# Patient Record
Sex: Female | Born: 1935 | Race: White | Hispanic: No | Marital: Married | Smoking: Never smoker
Health system: Southern US, Community
[De-identification: ages and names within clinical notes are randomized; demographics above are authoritative.]

## PROBLEM LIST (undated history)

## (undated) DIAGNOSIS — M810 Age-related osteoporosis without current pathological fracture: Secondary | ICD-10-CM

## (undated) DIAGNOSIS — I700 Atherosclerosis of aorta: Secondary | ICD-10-CM

## (undated) DIAGNOSIS — K219 Gastro-esophageal reflux disease without esophagitis: Secondary | ICD-10-CM

## (undated) DIAGNOSIS — T148XXA Other injury of unspecified body region, initial encounter: Secondary | ICD-10-CM

## (undated) DIAGNOSIS — M25559 Pain in unspecified hip: Secondary | ICD-10-CM

## (undated) DIAGNOSIS — R001 Bradycardia, unspecified: Secondary | ICD-10-CM

## (undated) DIAGNOSIS — I10 Essential (primary) hypertension: Secondary | ICD-10-CM

## (undated) HISTORY — DX: Pain in unspecified hip: M25.559

## (undated) HISTORY — DX: Age-related osteoporosis without current pathological fracture: M81.0

## (undated) HISTORY — DX: Atherosclerosis of aorta: I70.0

## (undated) HISTORY — PX: ANKLE FRACTURE SURGERY: SHX122

## (undated) HISTORY — DX: Other injury of unspecified body region, initial encounter: T14.8XXA

## (undated) HISTORY — DX: Bradycardia, unspecified: R00.1

## (undated) HISTORY — DX: Gastro-esophageal reflux disease without esophagitis: K21.9

## (undated) HISTORY — PX: ABDOMINAL HYSTERECTOMY: SHX81

---

## 1999-09-21 ENCOUNTER — Other Ambulatory Visit: Admission: RE | Admit: 1999-09-21 | Discharge: 1999-09-21 | Payer: Self-pay | Admitting: Obstetrics and Gynecology

## 2000-09-22 ENCOUNTER — Other Ambulatory Visit: Admission: RE | Admit: 2000-09-22 | Discharge: 2000-09-22 | Payer: Self-pay | Admitting: Obstetrics and Gynecology

## 2001-09-23 ENCOUNTER — Other Ambulatory Visit: Admission: RE | Admit: 2001-09-23 | Discharge: 2001-09-23 | Payer: Self-pay | Admitting: Obstetrics and Gynecology

## 2018-08-24 DIAGNOSIS — M419 Scoliosis, unspecified: Secondary | ICD-10-CM

## 2018-08-24 DIAGNOSIS — M5416 Radiculopathy, lumbar region: Secondary | ICD-10-CM

## 2018-08-24 HISTORY — DX: Scoliosis, unspecified: M41.9

## 2018-08-24 HISTORY — DX: Radiculopathy, lumbar region: M54.16

## 2018-09-07 ENCOUNTER — Ambulatory Visit: Payer: Self-pay | Admitting: Orthopedic Surgery

## 2018-09-09 ENCOUNTER — Ambulatory Visit: Payer: Self-pay | Admitting: Orthopedic Surgery

## 2018-09-09 NOTE — H&P (Signed)
Donna Christian is an 83 y.o. female.   Chief Complaint: back and R leg pain HPI: Reported by patient. Reason for Visit: (normal) review of test results (lumbar MRI); The patient is 1 month out from when symptoms began. 07/26/18 Location (Lower Extremity): leg pain on the right, , ; right buttock Severity: pain level 5/10 Timing: constant Aggravating Factors: standing for ; sitting for Medications: The patient is taking Tramadol and Aleve. Patient waiting for the epidural.  Past Medical Hx CA HTN Osteoporosis  Medications alendronate 70 mg tablet amLODIPine 10 mg tablet losartan 100 mg-hydrochlorothiazide 12.5 mg tablet metoprolol succinate ER 50 mg tablet,extended release 24 hr traMADol 50 mg tablet  No family history on file.   Social History Tobacco Smoking Status: Never smoker Chewing tobacco: none Alcohol intake: None Work related injury?: N Advance directive: Y Freight forwarder of Attorney: N Marital status: Married  Allergies: NKDA  Review of Systems  Constitutional: Negative.   HENT: Negative.   Eyes: Negative.   Respiratory: Negative.   Cardiovascular: Negative.   Gastrointestinal: Negative.   Genitourinary: Negative.   Musculoskeletal: Positive for back pain.  Skin: Negative.   Neurological: Positive for sensory change and focal weakness.  Psychiatric/Behavioral: Negative.     There were no vitals taken for this visit. Physical Exam  Constitutional: She is oriented to person, place, and time. She appears well-developed.  HENT:  Head: Normocephalic.  Eyes: Pupils are equal, round, and reactive to light.  Neck: Normal range of motion.  Cardiovascular: Normal rate.  Respiratory: Effort normal.  GI: Soft.  Musculoskeletal:  Patient is an 83 year old female.  Gait and Station: Appearance: ambulating with no assistive devices and antalgic gait.  Constitutional: General Appearance: healthy-appearing and distress (mild).  Psychiatric: Mood and Affect:  active and alert.  Cardiovascular System: Edema Right: none; Dorsalis and posterior tibial pulses 2+. Edema Left: none.  Abdomen: Inspection and Palpation: non-distended and no tenderness.  Skin: Inspection and palpation: no rash.  Lumbar Spine: Inspection: normal alignment. Bony Palpation of the Lumbar Spine: tender at lumbosacral junction.. Bony Palpation of the Right Hip: no tenderness of the greater trochanter and tenderness of the SI joint; Pelvis stable. Bony Palpation of the Left Hip: no tenderness of the greater trochanter and tenderness of the SI joint. Soft Tissue Palpation on the Right: No flank pain with percussion. Active Range of Motion: limited flexion and extention.  Motor Strength: L1 Motor Strength on the Right: hip flexion iliopsoas 5/5. L1 Motor Strength on the Left: hip flexion iliopsoas 5/5. L2-L4 Motor Strength on the Right: knee extension quadriceps 5/5. L2-L4 Motor Strength on the Left: knee extension quadriceps 5/5. L5 Motor Strength on the Right: ankle dorsiflexion tibialis anterior 5/5 and great toe extension extensor hallucis longus 5/5. L5 Motor Strength on the Left: ankle dorsiflexion tibialis anterior 5/5 and great toe extension extensor hallucis longus 5/5. S1 Motor Strength on the Right: plantar flexion gastrocnemius 5/5. S1 Motor Strength on the Left: plantar flexion gastrocnemius 5/5.  Neurological System: Knee Reflex Right: normal (2). Knee Reflex Left: normal (2). Ankle Reflex Right: normal (2). Ankle Reflex Left: normal (2). Babinski Reflex Right: plantar reflex absent. Babinski Reflex Left: plantar reflex absent. Sensation on the Right: normal distal extremities. Sensation on the Left: normal distal extremities. Special Tests on the Right: no clonus of the ankle/knee and seated straight leg raising test positive. Special Tests on the Left: no clonus of the ankle/knee and seated straight leg raising test positive.  Normal cervical lordosis. No pain with  range of  motion. No palpable tenderness. Motor is 5/5 in all groups in the upper extremities. Upper extremity sensory exam normal. Patient is normoreflexic in the upper extremities. No Hoffmann sign.  Neurological: She is alert and oriented to person, place, and time.  Skin: Skin is warm and dry.    MRI demonstrates large disc herniation paracentral to the right displacement of the 4 root.  Assessment/Plan Patient demonstrates L4-L5 radiculopathy secondary disc herniation L4-5 to the right.  EHL weakness slight quad weakness.  Discussed this with her over the phone.  It is likely this will require surgical intervention for relief. She has been miserable.  In the interim were trying to expedite an epidural steroid injection.  In the meantime I gave her a SI joint injection on the right which he tolerated well.  In addition she can increase her tramadol from 1 a day up to 3 a day. Also take Tylenol on top of that.  Also activity modification strategies to unload the disc.  Also discussed surgical intervention I will start that process with the preoperative clearance.  I had an extensive discussion with the patient concerning the pathology relevant anatomy and treatment options. At this point exhausting conservative treatment and in the presence of a neurologic deficit we discussed microlumbar decompression. I discussed the risks and benefits including bleeding, infection, DVT, PE, anesthetic complications, worsening in their symptoms, improvement in their symptoms, C SF leakage, epidural fibrosis, need for future surgeries such as revision discectomy and lumbar fusion. I also indicated that this is an operation to basically decompress the nerve root to allow recovery as opposed to fixing a herniated disc and that the incidence of recurrent chest disc herniation can approach 15%. Also that nerve root recovery is variable and may not recover completely.  I discussed the operative course including  overnight in the hospital. Immediate ambulation. Follow-up in 2 weeks for suture removal. 6 weeks until healing of the herniation followed by 6 weeks of reconditioning and strengthening of the core musculature. Also discussed the need to employ the concepts of disc pressure management and core motion following the surgery to minimize the risk of recurrent disc herniation. We will obtain preoperative clearance i if necessary and proceed accordingly.  Plan microlumbar decompression L4-5 right  Cecilie Kicks., PA-C for Dr. Tonita Cong 09/09/2018, 8:36 AM

## 2018-09-09 NOTE — H&P (View-Only) (Signed)
Donna Christian is an 83 y.o. female.   Chief Complaint: back and R leg pain HPI: Reported by patient. Reason for Visit: (normal) review of test results (lumbar MRI); The patient is 1 month out from when symptoms began. 07/26/18 Location (Lower Extremity): leg pain on the right, , ; right buttock Severity: pain level 5/10 Timing: constant Aggravating Factors: standing for ; sitting for Medications: The patient is taking Tramadol and Aleve. Patient waiting for the epidural.  Past Medical Hx CA HTN Osteoporosis  Medications alendronate 70 mg tablet amLODIPine 10 mg tablet losartan 100 mg-hydrochlorothiazide 12.5 mg tablet metoprolol succinate ER 50 mg tablet,extended release 24 hr traMADol 50 mg tablet  No family history on file.   Social History Tobacco Smoking Status: Never smoker Chewing tobacco: none Alcohol intake: None Work related injury?: N Advance directive: Y Freight forwarder of Attorney: N Marital status: Married  Allergies: NKDA  Review of Systems  Constitutional: Negative.   HENT: Negative.   Eyes: Negative.   Respiratory: Negative.   Cardiovascular: Negative.   Gastrointestinal: Negative.   Genitourinary: Negative.   Musculoskeletal: Positive for back pain.  Skin: Negative.   Neurological: Positive for sensory change and focal weakness.  Psychiatric/Behavioral: Negative.     There were no vitals taken for this visit. Physical Exam  Constitutional: She is oriented to person, place, and time. She appears well-developed.  HENT:  Head: Normocephalic.  Eyes: Pupils are equal, round, and reactive to light.  Neck: Normal range of motion.  Cardiovascular: Normal rate.  Respiratory: Effort normal.  GI: Soft.  Musculoskeletal:  Patient is an 83 year old female.  Gait and Station: Appearance: ambulating with no assistive devices and antalgic gait.  Constitutional: General Appearance: healthy-appearing and distress (mild).  Psychiatric: Mood and Affect:  active and alert.  Cardiovascular System: Edema Right: none; Dorsalis and posterior tibial pulses 2+. Edema Left: none.  Abdomen: Inspection and Palpation: non-distended and no tenderness.  Skin: Inspection and palpation: no rash.  Lumbar Spine: Inspection: normal alignment. Bony Palpation of the Lumbar Spine: tender at lumbosacral junction.. Bony Palpation of the Right Hip: no tenderness of the greater trochanter and tenderness of the SI joint; Pelvis stable. Bony Palpation of the Left Hip: no tenderness of the greater trochanter and tenderness of the SI joint. Soft Tissue Palpation on the Right: No flank pain with percussion. Active Range of Motion: limited flexion and extention.  Motor Strength: L1 Motor Strength on the Right: hip flexion iliopsoas 5/5. L1 Motor Strength on the Left: hip flexion iliopsoas 5/5. L2-L4 Motor Strength on the Right: knee extension quadriceps 5/5. L2-L4 Motor Strength on the Left: knee extension quadriceps 5/5. L5 Motor Strength on the Right: ankle dorsiflexion tibialis anterior 5/5 and great toe extension extensor hallucis longus 5/5. L5 Motor Strength on the Left: ankle dorsiflexion tibialis anterior 5/5 and great toe extension extensor hallucis longus 5/5. S1 Motor Strength on the Right: plantar flexion gastrocnemius 5/5. S1 Motor Strength on the Left: plantar flexion gastrocnemius 5/5.  Neurological System: Knee Reflex Right: normal (2). Knee Reflex Left: normal (2). Ankle Reflex Right: normal (2). Ankle Reflex Left: normal (2). Babinski Reflex Right: plantar reflex absent. Babinski Reflex Left: plantar reflex absent. Sensation on the Right: normal distal extremities. Sensation on the Left: normal distal extremities. Special Tests on the Right: no clonus of the ankle/knee and seated straight leg raising test positive. Special Tests on the Left: no clonus of the ankle/knee and seated straight leg raising test positive.  Normal cervical lordosis. No pain with  range of  motion. No palpable tenderness. Motor is 5/5 in all groups in the upper extremities. Upper extremity sensory exam normal. Patient is normoreflexic in the upper extremities. No Hoffmann sign.  Neurological: She is alert and oriented to person, place, and time.  Skin: Skin is warm and dry.    MRI demonstrates large disc herniation paracentral to the right displacement of the 4 root.  Assessment/Plan Patient demonstrates L4-L5 radiculopathy secondary disc herniation L4-5 to the right.  EHL weakness slight quad weakness.  Discussed this with her over the phone.  It is likely this will require surgical intervention for relief. She has been miserable.  In the interim were trying to expedite an epidural steroid injection.  In the meantime I gave her a SI joint injection on the right which he tolerated well.  In addition she can increase her tramadol from 1 a day up to 3 a day. Also take Tylenol on top of that.  Also activity modification strategies to unload the disc.  Also discussed surgical intervention I will start that process with the preoperative clearance.  I had an extensive discussion with the patient concerning the pathology relevant anatomy and treatment options. At this point exhausting conservative treatment and in the presence of a neurologic deficit we discussed microlumbar decompression. I discussed the risks and benefits including bleeding, infection, DVT, PE, anesthetic complications, worsening in their symptoms, improvement in their symptoms, C SF leakage, epidural fibrosis, need for future surgeries such as revision discectomy and lumbar fusion. I also indicated that this is an operation to basically decompress the nerve root to allow recovery as opposed to fixing a herniated disc and that the incidence of recurrent chest disc herniation can approach 15%. Also that nerve root recovery is variable and may not recover completely.  I discussed the operative course including  overnight in the hospital. Immediate ambulation. Follow-up in 2 weeks for suture removal. 6 weeks until healing of the herniation followed by 6 weeks of reconditioning and strengthening of the core musculature. Also discussed the need to employ the concepts of disc pressure management and core motion following the surgery to minimize the risk of recurrent disc herniation. We will obtain preoperative clearance i if necessary and proceed accordingly.  Plan microlumbar decompression L4-5 right  Cecilie Kicks., PA-C for Dr. Tonita Cong 09/09/2018, 8:36 AM

## 2018-09-16 NOTE — Pre-Procedure Instructions (Signed)
Donna Christian  09/16/2018      [facility], [place] [address] Phone: [phone] Fax: [phone]    Your procedure is scheduled on Oct. 31  Report to [facility] at 5;30  A.M.  Call this number if you have problems the morning of surgery:  [phone]   Remember:   Do not eat or drink after midnight.      Take these medicines the morning of surgery with A SIP OF WATER :              Amlodipine (norvasc)             Metoprolol (toprol-XL)              7 days prior to surgery STOP taking any Aspirin(unless otherwise instructed by your surgeon), Aleve, Naproxen, Ibuprofen, Motrin, Advil, Goody's, BC's, all herbal medications, fish oil, and all vitamins    Do not wear jewelry, make-up or nail polish.  Do not wear lotions, powders, or perfumes, or deodorant.  Do not shave 48 hours prior to surgery.  Men may shave face and neck.  Do not bring valuables to the hospital.  [facility] is not responsible for any belongings or valuables.  Contacts, dentures or bridgework may not be worn into surgery.  Leave your suitcase in the car.  After surgery it may be brought to your room.  For patients admitted to the hospital, discharge time will be determined by your treatment team.  Patients discharged the day of surgery will not be allowed to drive home.    Special instructions:  [place]- Preparing For Surgery  Before surgery, you can play an important role. Because skin is not sterile, your skin needs to be as free of germs as possible. You can reduce the number of germs on your skin by washing with CHG (chlorahexidine gluconate) Soap before surgery.  CHG is an antiseptic cleaner which kills germs and bonds with the skin to continue killing germs even after washing.    Oral Hygiene is also important to reduce your risk of infection.  Remember - BRUSH YOUR TEETH THE MORNING OF SURGERY  WITH YOUR REGULAR TOOTHPASTE  Please do not use if you have an allergy to CHG or antibacterial soaps. If your skin becomes reddened/irritated stop using the CHG.  Do not shave (including legs and underarms) for at least 48 hours prior to first CHG shower. It is OK to shave your face.  Please follow these instructions carefully.   1. Shower the NIGHT BEFORE SURGERY and the MORNING OF SURGERY with CHG.   2. If you chose to wash your hair, wash your hair first as usual with your normal shampoo.  3. After you shampoo, rinse your hair and body thoroughly to remove the shampoo.  4. Use CHG as you would any other liquid soap. You can apply CHG directly to the skin and wash gently with a scrungie or a clean washcloth.   5. Apply the CHG Soap to your body ONLY FROM THE NECK DOWN.  Do not use on open wounds or open sores. Avoid contact with your eyes, ears, mouth and genitals (private parts). Wash Face and genitals (private parts)  with your normal soap.  6. Wash thoroughly, paying special attention to the area where your surgery will be performed.  7. Thoroughly rinse your body with warm water from the neck down.  8. DO NOT shower/wash with your normal soap after using and rinsing off the CHG Soap.  9. Pat yourself dry with a CLEAN TOWEL.  10. Wear CLEAN PAJAMAS to bed the night before surgery, wear comfortable clothes the morning of surgery  11. Place CLEAN SHEETS on your bed the night of your first shower and DO NOT SLEEP WITH PETS.    Day of Surgery:  Do not apply any deodorants/lotions.  Please wear clean clothes to the hospital/surgery center.   Remember to brush your teeth WITH YOUR REGULAR TOOTHPASTE.    Please read over the following fact sheets that you were given. Coughing and Deep Breathing, MRSA Information and Surgical Site Infection Prevention

## 2018-09-17 ENCOUNTER — Encounter (HOSPITAL_COMMUNITY)
Admission: RE | Admit: 2018-09-17 | Discharge: 2018-09-17 | Disposition: A | Discharge status: Home / Self Care | Payer: Medicare Other | Source: Ambulatory Visit | Attending: Specialist | Admitting: Specialist

## 2018-09-17 ENCOUNTER — Other Ambulatory Visit: Payer: Self-pay

## 2018-09-17 ENCOUNTER — Ambulatory Visit (HOSPITAL_COMMUNITY)
Admission: RE | Admit: 2018-09-17 | Discharge: 2018-09-17 | Disposition: A | Discharge status: Home / Self Care | Payer: Medicare Other | Source: Ambulatory Visit | Attending: Orthopedic Surgery | Admitting: Orthopedic Surgery

## 2018-09-17 ENCOUNTER — Encounter (HOSPITAL_COMMUNITY): Payer: Self-pay

## 2018-09-17 DIAGNOSIS — Z01818 Encounter for other preprocedural examination: Secondary | ICD-10-CM | POA: Diagnosis present

## 2018-09-17 DIAGNOSIS — M5136 Other intervertebral disc degeneration, lumbar region: Secondary | ICD-10-CM | POA: Insufficient documentation

## 2018-09-17 DIAGNOSIS — M5126 Other intervertebral disc displacement, lumbar region: Secondary | ICD-10-CM

## 2018-09-17 DIAGNOSIS — M48061 Spinal stenosis, lumbar region without neurogenic claudication: Secondary | ICD-10-CM | POA: Diagnosis not present

## 2018-09-17 DIAGNOSIS — I700 Atherosclerosis of aorta: Secondary | ICD-10-CM | POA: Insufficient documentation

## 2018-09-17 DIAGNOSIS — M4186 Other forms of scoliosis, lumbar region: Secondary | ICD-10-CM | POA: Insufficient documentation

## 2018-09-17 HISTORY — DX: Essential (primary) hypertension: I10

## 2018-09-17 LAB — BASIC METABOLIC PANEL
Anion gap: 5 (ref 5–15)
BUN: 24 mg/dL — AB (ref 8–23)
CHLORIDE: 109 mmol/L (ref 98–111)
CO2: 29 mmol/L (ref 22–32)
CREATININE: 0.81 mg/dL (ref 0.44–1.00)
Calcium: 8.7 mg/dL — ABNORMAL LOW (ref 8.9–10.3)
GFR calc Af Amer: >60 mL/min (ref >60)
GFR calc non Af Amer: >60 mL/min (ref >60)
GLUCOSE: 113 mg/dL — AB (ref 70–99)
Potassium: 3.4 mmol/L — ABNORMAL LOW (ref 3.5–5.1)
Sodium: 143 mmol/L (ref 135–145)

## 2018-09-17 LAB — CBC
HEMATOCRIT: 43 % (ref 36.0–46.0)
Hemoglobin: 14.2 g/dL (ref 12.0–15.0)
MCH: 29 pg (ref 26.0–34.0)
MCHC: 33 g/dL (ref 30.0–36.0)
MCV: 87.8 fL (ref 80.0–100.0)
Platelets: 348 10*3/uL (ref 150–400)
RBC: 4.9 MIL/uL (ref 3.87–5.11)
RDW: 13.6 % (ref 11.5–15.5)
WBC: 8.9 10*3/uL (ref 4.0–10.5)
nRBC: 0 % (ref 0.0–0.2)

## 2018-09-17 LAB — SURGICAL PCR SCREEN
MRSA, PCR: NEGATIVE
Staphylococcus aureus: NEGATIVE

## 2018-09-17 NOTE — Progress Notes (Signed)
PCP - Dr. Maury Dus  Cardiologist - Denies  Chest x-ray - Lumbar- 09/17/18  EKG - 09/17/18  Stress Test - Denies  ECHO - Denies  Cardiac Cath - Denies  AICD- n/a PM- n/a LOOP- n/a  Sleep Study - Denies CPAP - None  LABS- 09/17/18: CBC, BMP, PCR  ASA- Denies   Anesthesia- No  Pt denies having chest pain, sob, or fever at this time. All instructions explained to the pt, with a verbal understanding of the material. Pt agrees to go over the instructions while at home for a better understanding. The opportunity to ask questions was provided.

## 2018-09-23 NOTE — Anesthesia Preprocedure Evaluation (Addendum)
Anesthesia Evaluation  Patient identified by MRN, date of birth, ID band Patient awake    Reviewed: Allergy & Precautions, NPO status , Patient's Chart, lab work & pertinent test results  Airway Mallampati: II  TM Distance: >3 FB Neck ROM: Full    Dental no notable dental hx. (+) Teeth Intact, Dental Advisory Given   Pulmonary neg pulmonary ROS,    Pulmonary exam normal breath sounds clear to auscultation       Cardiovascular hypertension, Pt. on medications and Pt. on home beta blockers negative cardio ROS Normal cardiovascular exam Rhythm:Regular Rate:Normal     Neuro/Psych negative neurological ROS  negative psych ROS   GI/Hepatic negative GI ROS, Neg liver ROS,   Endo/Other  negative endocrine ROS  Renal/GU negative Renal ROS  negative genitourinary   Musculoskeletal  (+) Arthritis , Osteoarthritis,    Abdominal   Peds  Hematology negative hematology ROS (+)   Anesthesia Other Findings L4-5 herniated disc, RLE paresthesias   Reproductive/Obstetrics                           Anesthesia Physical Anesthesia Plan  ASA: II  Anesthesia Plan: General   Post-op Pain Management:    Induction: Intravenous  PONV Risk Score and Plan: 3 and Treatment may vary due to age or medical condition, Dexamethasone and Ondansetron  Airway Management Planned: Oral ETT  Additional Equipment:   Intra-op Plan:   Post-operative Plan: Extubation in OR  Informed Consent: I have reviewed the patients History and Physical, chart, labs and discussed the procedure including the risks, benefits and alternatives for the proposed anesthesia with the patient or authorized representative who has indicated his/her understanding and acceptance.   Dental advisory given  Plan Discussed with: CRNA  Anesthesia Plan Comments:        Anesthesia Quick Evaluation

## 2018-09-24 ENCOUNTER — Ambulatory Visit (HOSPITAL_COMMUNITY): Payer: Medicare Other

## 2018-09-24 ENCOUNTER — Ambulatory Visit (HOSPITAL_COMMUNITY): Payer: Medicare Other | Admitting: Anesthesiology

## 2018-09-24 ENCOUNTER — Ambulatory Visit (HOSPITAL_COMMUNITY)
Admission: RE | Disposition: A | Discharge status: Home / Self Care | Payer: Self-pay | Source: Ambulatory Visit | Attending: Specialist

## 2018-09-24 ENCOUNTER — Encounter (HOSPITAL_COMMUNITY): Payer: Self-pay | Admitting: Certified Registered Nurse Anesthetist

## 2018-09-24 ENCOUNTER — Ambulatory Visit (HOSPITAL_COMMUNITY)
Admission: RE | Admit: 2018-09-24 | Discharge: 2018-09-25 | Disposition: A | Discharge status: Home / Self Care | Payer: Medicare Other | Source: Ambulatory Visit | Attending: Specialist | Admitting: Specialist

## 2018-09-24 DIAGNOSIS — M48061 Spinal stenosis, lumbar region without neurogenic claudication: Secondary | ICD-10-CM | POA: Diagnosis not present

## 2018-09-24 DIAGNOSIS — I10 Essential (primary) hypertension: Secondary | ICD-10-CM | POA: Diagnosis not present

## 2018-09-24 DIAGNOSIS — Z79899 Other long term (current) drug therapy: Secondary | ICD-10-CM | POA: Diagnosis not present

## 2018-09-24 DIAGNOSIS — Z419 Encounter for procedure for purposes other than remedying health state, unspecified: Secondary | ICD-10-CM

## 2018-09-24 DIAGNOSIS — M5116 Intervertebral disc disorders with radiculopathy, lumbar region: Secondary | ICD-10-CM | POA: Insufficient documentation

## 2018-09-24 DIAGNOSIS — R2681 Unsteadiness on feet: Secondary | ICD-10-CM | POA: Diagnosis not present

## 2018-09-24 HISTORY — PX: LUMBAR LAMINECTOMY/DECOMPRESSION MICRODISCECTOMY: SHX5026

## 2018-09-24 HISTORY — DX: Spinal stenosis, lumbar region without neurogenic claudication: M48.061

## 2018-09-24 SURGERY — LUMBAR LAMINECTOMY/DECOMPRESSION MICRODISCECTOMY 1 LEVEL
Anesthesia: General | Site: Spine Lumbar

## 2018-09-24 MED ORDER — ALUM & MAG HYDROXIDE-SIMETH 200-200-20 MG/5ML PO SUSP: 30.0000 mL | Freq: Four times a day (QID) | ORAL | Status: DC | PRN

## 2018-09-24 MED ORDER — AMLODIPINE BESYLATE 5 MG PO TABS
10.0000 mg | ORAL_TABLET | Freq: Every day | ORAL | Status: DC
  Administered 2018-09-25: 10 mg via ORAL
  Filled 2018-09-24: qty 2

## 2018-09-24 MED ORDER — LOSARTAN POTASSIUM 50 MG PO TABS
100.0000 mg | ORAL_TABLET | Freq: Every day | ORAL | Status: DC
  Administered 2018-09-25: 100 mg via ORAL
  Filled 2018-09-24: qty 2

## 2018-09-24 MED ORDER — ONDANSETRON HCL 4 MG/2ML IJ SOLN
INTRAMUSCULAR | Status: DC | PRN
  Administered 2018-09-24: 4 mg via INTRAVENOUS

## 2018-09-24 MED ORDER — FENTANYL CITRATE (PF) 250 MCG/5ML IJ SOLN
INTRAMUSCULAR | Status: AC
  Filled 2018-09-24: qty 5

## 2018-09-24 MED ORDER — CEFAZOLIN SODIUM-DEXTROSE 2-4 GM/100ML-% IV SOLN
2.0000 g | INTRAVENOUS | Status: AC
  Administered 2018-09-24: 2 g via INTRAVENOUS

## 2018-09-24 MED ORDER — LACTATED RINGERS IV SOLN: INTRAVENOUS | Status: DC

## 2018-09-24 MED ORDER — BUPIVACAINE-EPINEPHRINE 0.5% -1:200000 IJ SOLN
INTRAMUSCULAR | Status: DC | PRN
  Administered 2018-09-24: 7 mL

## 2018-09-24 MED ORDER — PROPOFOL 10 MG/ML IV BOLUS
INTRAVENOUS | Status: DC | PRN
  Administered 2018-09-24: 90 mg via INTRAVENOUS

## 2018-09-24 MED ORDER — ACETAMINOPHEN 10 MG/ML IV SOLN
1000.0000 mg | INTRAVENOUS | Status: AC
  Administered 2018-09-24: 1000 mg via INTRAVENOUS
  Filled 2018-09-24: qty 100

## 2018-09-24 MED ORDER — PHENOL 1.4 % MT LIQD: 1.0000 | OROMUCOSAL | Status: DC | PRN

## 2018-09-24 MED ORDER — 0.9 % SODIUM CHLORIDE (POUR BTL) OPTIME
TOPICAL | Status: DC | PRN
  Administered 2018-09-24: 1000 mL

## 2018-09-24 MED ORDER — MAGNESIUM CITRATE PO SOLN: 1.0000 | Freq: Once | ORAL | Status: DC | PRN

## 2018-09-24 MED ORDER — METHOCARBAMOL 1000 MG/10ML IJ SOLN
500.0000 mg | Freq: Four times a day (QID) | INTRAVENOUS | Status: DC | PRN
  Filled 2018-09-24: qty 5

## 2018-09-24 MED ORDER — ROCURONIUM BROMIDE 50 MG/5ML IV SOSY
PREFILLED_SYRINGE | INTRAVENOUS | Status: DC | PRN
  Administered 2018-09-24: 50 mg via INTRAVENOUS

## 2018-09-24 MED ORDER — SUGAMMADEX SODIUM 200 MG/2ML IV SOLN
INTRAVENOUS | Status: DC | PRN
  Administered 2018-09-24: 150 mg via INTRAVENOUS

## 2018-09-24 MED ORDER — ONDANSETRON HCL 4 MG/2ML IJ SOLN: 4.0000 mg | Freq: Four times a day (QID) | INTRAMUSCULAR | Status: DC | PRN

## 2018-09-24 MED ORDER — RISAQUAD PO CAPS
1.0000 | ORAL_CAPSULE | Freq: Every day | ORAL | Status: DC
  Administered 2018-09-24 – 2018-09-25 (×2): 1 via ORAL
  Filled 2018-09-24 (×2): qty 1

## 2018-09-24 MED ORDER — BUPIVACAINE-EPINEPHRINE 0.5% -1:200000 IJ SOLN
INTRAMUSCULAR | Status: AC
  Filled 2018-09-24: qty 1

## 2018-09-24 MED ORDER — PHENYLEPHRINE 40 MCG/ML (10ML) SYRINGE FOR IV PUSH (FOR BLOOD PRESSURE SUPPORT)
PREFILLED_SYRINGE | INTRAVENOUS | Status: AC
  Filled 2018-09-24: qty 10

## 2018-09-24 MED ORDER — METHOCARBAMOL 500 MG PO TABS: 500.0000 mg | ORAL_TABLET | Freq: Four times a day (QID) | ORAL | Status: DC | PRN

## 2018-09-24 MED ORDER — OXYCODONE HCL 5 MG PO TABS
5.0000 mg | ORAL_TABLET | ORAL | Status: DC | PRN
  Administered 2018-09-25: 5 mg via ORAL
  Filled 2018-09-24: qty 1

## 2018-09-24 MED ORDER — SODIUM CHLORIDE 0.9 % IV SOLN
INTRAVENOUS | Status: DC | PRN
  Administered 2018-09-24: 500 mL

## 2018-09-24 MED ORDER — LOSARTAN POTASSIUM-HCTZ 100-12.5 MG PO TABS: 1.0000 | ORAL_TABLET | Freq: Every day | ORAL | Status: DC

## 2018-09-24 MED ORDER — DEXAMETHASONE SODIUM PHOSPHATE 10 MG/ML IJ SOLN
INTRAMUSCULAR | Status: AC
  Filled 2018-09-24: qty 1

## 2018-09-24 MED ORDER — PROPOFOL 10 MG/ML IV BOLUS
INTRAVENOUS | Status: AC
  Filled 2018-09-24: qty 20

## 2018-09-24 MED ORDER — LACTATED RINGERS IV SOLN
INTRAVENOUS | Status: DC | PRN
  Administered 2018-09-24 (×2): via INTRAVENOUS

## 2018-09-24 MED ORDER — BISACODYL 5 MG PO TBEC: 5.0000 mg | DELAYED_RELEASE_TABLET | Freq: Every day | ORAL | Status: DC | PRN

## 2018-09-24 MED ORDER — LIDOCAINE 2% (20 MG/ML) 5 ML SYRINGE
INTRAMUSCULAR | Status: AC
  Filled 2018-09-24: qty 5

## 2018-09-24 MED ORDER — ACETAMINOPHEN 325 MG PO TABS: 650.0000 mg | ORAL_TABLET | ORAL | Status: DC | PRN

## 2018-09-24 MED ORDER — EPHEDRINE SULFATE 50 MG/ML IJ SOLN
INTRAMUSCULAR | Status: DC | PRN
  Administered 2018-09-24 (×3): 5 mg via INTRAVENOUS

## 2018-09-24 MED ORDER — OXYCODONE HCL 5 MG PO TABS: 5.0000 mg | ORAL_TABLET | ORAL | 0 refills | Status: AC | PRN

## 2018-09-24 MED ORDER — METOPROLOL SUCCINATE ER 50 MG PO TB24
50.0000 mg | ORAL_TABLET | Freq: Every day | ORAL | Status: DC
  Administered 2018-09-25: 50 mg via ORAL
  Filled 2018-09-24: qty 1

## 2018-09-24 MED ORDER — CEFAZOLIN SODIUM-DEXTROSE 1-4 GM/50ML-% IV SOLN
1.0000 g | Freq: Three times a day (TID) | INTRAVENOUS | Status: AC
  Administered 2018-09-24 (×2): 1 g via INTRAVENOUS
  Filled 2018-09-24 (×2): qty 50

## 2018-09-24 MED ORDER — TRAMADOL HCL 50 MG PO TABS
50.0000 mg | ORAL_TABLET | Freq: Every day | ORAL | Status: DC
  Administered 2018-09-24: 50 mg via ORAL
  Filled 2018-09-24: qty 1

## 2018-09-24 MED ORDER — MENTHOL 3 MG MT LOZG: 1.0000 | LOZENGE | OROMUCOSAL | Status: DC | PRN

## 2018-09-24 MED ORDER — THROMBIN 20000 UNITS EX SOLR
CUTANEOUS | Status: DC | PRN
  Administered 2018-09-24: 08:00:00 via TOPICAL

## 2018-09-24 MED ORDER — KCL IN DEXTROSE-NACL 20-5-0.45 MEQ/L-%-% IV SOLN: INTRAVENOUS | Status: AC

## 2018-09-24 MED ORDER — DOCUSATE SODIUM 100 MG PO CAPS
100.0000 mg | ORAL_CAPSULE | Freq: Two times a day (BID) | ORAL | Status: DC
  Administered 2018-09-24: 100 mg via ORAL
  Filled 2018-09-24 (×2): qty 1

## 2018-09-24 MED ORDER — NAPROXEN SODIUM 220 MG PO TABS: 440.0000 mg | ORAL_TABLET | Freq: Every day | ORAL | Status: AC

## 2018-09-24 MED ORDER — HYDROMORPHONE HCL 1 MG/ML IJ SOLN: 0.5000 mg | INTRAMUSCULAR | Status: DC | PRN

## 2018-09-24 MED ORDER — ONDANSETRON HCL 4 MG/2ML IJ SOLN
INTRAMUSCULAR | Status: AC
  Filled 2018-09-24: qty 2

## 2018-09-24 MED ORDER — HYDROCHLOROTHIAZIDE 12.5 MG PO CAPS
12.5000 mg | ORAL_CAPSULE | Freq: Every day | ORAL | Status: DC
  Administered 2018-09-25: 12.5 mg via ORAL
  Filled 2018-09-24: qty 1

## 2018-09-24 MED ORDER — OXYCODONE HCL 5 MG PO TABS
10.0000 mg | ORAL_TABLET | ORAL | Status: DC | PRN
  Administered 2018-09-24: 5 mg via ORAL
  Filled 2018-09-24: qty 2

## 2018-09-24 MED ORDER — CEFAZOLIN SODIUM-DEXTROSE 2-4 GM/100ML-% IV SOLN
INTRAVENOUS | Status: AC
  Filled 2018-09-24: qty 100

## 2018-09-24 MED ORDER — THROMBIN (RECOMBINANT) 20000 UNITS EX SOLR
CUTANEOUS | Status: AC
  Filled 2018-09-24: qty 20000

## 2018-09-24 MED ORDER — FENTANYL CITRATE (PF) 100 MCG/2ML IJ SOLN
INTRAMUSCULAR | Status: DC | PRN
  Administered 2018-09-24: 25 ug via INTRAVENOUS
  Administered 2018-09-24 (×2): 50 ug via INTRAVENOUS

## 2018-09-24 MED ORDER — POLYETHYLENE GLYCOL 3350 17 G PO PACK: 17.0000 g | PACK | Freq: Every day | ORAL | Status: DC | PRN

## 2018-09-24 MED ORDER — DEXAMETHASONE SODIUM PHOSPHATE 10 MG/ML IJ SOLN
INTRAMUSCULAR | Status: DC | PRN
  Administered 2018-09-24: 10 mg via INTRAVENOUS

## 2018-09-24 MED ORDER — POLYETHYLENE GLYCOL 3350 17 G PO PACK: 17.0000 g | PACK | Freq: Every day | ORAL | 0 refills | Status: AC

## 2018-09-24 MED ORDER — DOCUSATE SODIUM 100 MG PO CAPS: 100.0000 mg | ORAL_CAPSULE | Freq: Two times a day (BID) | ORAL | 2 refills | Status: AC

## 2018-09-24 MED ORDER — ACETAMINOPHEN 650 MG RE SUPP: 650.0000 mg | RECTAL | Status: DC | PRN

## 2018-09-24 MED ORDER — LIDOCAINE 2% (20 MG/ML) 5 ML SYRINGE
INTRAMUSCULAR | Status: DC | PRN
  Administered 2018-09-24: 60 mg via INTRAVENOUS

## 2018-09-24 MED ORDER — ONDANSETRON HCL 4 MG PO TABS: 4.0000 mg | ORAL_TABLET | Freq: Four times a day (QID) | ORAL | Status: DC | PRN

## 2018-09-24 SURGICAL SUPPLY — 57 items
BAG DECANTER FOR FLEXI CONT (MISCELLANEOUS) ×2 IMPLANT
CLEANER TIP ELECTROSURG 2X2 (MISCELLANEOUS) ×2 IMPLANT
CLOTH 2% CHLOROHEXIDINE 3PK (PERSONAL CARE ITEMS) ×2 IMPLANT
CLSR STERI-STRIP ANTIMIC 1/2X4 (GAUZE/BANDAGES/DRESSINGS) ×2 IMPLANT
CONT SPEC 4OZ CLIKSEAL STRL BL (MISCELLANEOUS) ×2 IMPLANT
COVER WAND RF STERILE (DRAPES) ×2 IMPLANT
DRAPE LAPAROTOMY 100X72X124 (DRAPES) ×2 IMPLANT
DRAPE MICROSCOPE LEICA (MISCELLANEOUS) ×2 IMPLANT
DRAPE SHEET LG 3/4 BI-LAMINATE (DRAPES) ×2 IMPLANT
DRAPE SURG 17X11 SM STRL (DRAPES) ×2 IMPLANT
DRAPE UTILITY XL STRL (DRAPES) ×2 IMPLANT
DRESSING AQUACEL AG SP 3.5X6 (GAUZE/BANDAGES/DRESSINGS) ×1 IMPLANT
DRSG AQUACEL AG ADV 3.5X 4 (GAUZE/BANDAGES/DRESSINGS) IMPLANT
DRSG AQUACEL AG ADV 3.5X 6 (GAUZE/BANDAGES/DRESSINGS) IMPLANT
DRSG AQUACEL AG SP 3.5X6 (GAUZE/BANDAGES/DRESSINGS) ×2
DRSG TELFA 3X8 NADH (GAUZE/BANDAGES/DRESSINGS) IMPLANT
DURAPREP 26ML APPLICATOR (WOUND CARE) ×2 IMPLANT
DURASEAL SPINE SEALANT 3ML (MISCELLANEOUS) IMPLANT
ELECT BLADE 4.0 EZ CLEAN MEGAD (MISCELLANEOUS) ×2
ELECT REM PT RETURN 9FT ADLT (ELECTROSURGICAL) ×2
ELECTRODE BLDE 4.0 EZ CLN MEGD (MISCELLANEOUS) ×1 IMPLANT
ELECTRODE REM PT RTRN 9FT ADLT (ELECTROSURGICAL) ×1 IMPLANT
GLOVE BIOGEL PI IND STRL 7.0 (GLOVE) ×3 IMPLANT
GLOVE BIOGEL PI IND STRL 7.5 (GLOVE) ×4 IMPLANT
GLOVE BIOGEL PI INDICATOR 7.0 (GLOVE) ×3
GLOVE BIOGEL PI INDICATOR 7.5 (GLOVE) ×4
GLOVE SURG SS PI 7.5 STRL IVOR (GLOVE) ×2 IMPLANT
GLOVE SURG SS PI 8.0 STRL IVOR (GLOVE) ×6 IMPLANT
GOWN STRL REUS W/ TWL LRG LVL3 (GOWN DISPOSABLE) ×1 IMPLANT
GOWN STRL REUS W/ TWL XL LVL3 (GOWN DISPOSABLE) ×2 IMPLANT
GOWN STRL REUS W/TWL LRG LVL3 (GOWN DISPOSABLE) ×1
GOWN STRL REUS W/TWL XL LVL3 (GOWN DISPOSABLE) ×2
IV CATH 14GX2 1/4 (CATHETERS) ×2 IMPLANT
KIT BASIN OR (CUSTOM PROCEDURE TRAY) ×2 IMPLANT
KIT POSITION SURG JACKSON T1 (MISCELLANEOUS) ×2 IMPLANT
NEEDLE 22X1 1/2 (OR ONLY) (NEEDLE) ×2 IMPLANT
NEEDLE SPNL 18GX3.5 QUINCKE PK (NEEDLE) ×4 IMPLANT
PACK LAMINECTOMY NEURO (CUSTOM PROCEDURE TRAY) ×2 IMPLANT
PATTIES SURGICAL .75X.75 (GAUZE/BANDAGES/DRESSINGS) ×2 IMPLANT
RUBBERBAND STERILE (MISCELLANEOUS) ×4 IMPLANT
SPONGE LAP 4X18 RFD (DISPOSABLE) ×2 IMPLANT
SPONGE SURGIFOAM ABS GEL 100 (HEMOSTASIS) ×2 IMPLANT
STAPLER VISISTAT (STAPLE) IMPLANT
STRIP CLOSURE SKIN 1/2X4 (GAUZE/BANDAGES/DRESSINGS) ×2 IMPLANT
SUT NURALON 4 0 TR CR/8 (SUTURE) IMPLANT
SUT PROLENE 3 0 PS 2 (SUTURE) ×2 IMPLANT
SUT VIC AB 1 CT1 27 (SUTURE) ×2
SUT VIC AB 1 CT1 27XBRD ANBCTR (SUTURE) ×2 IMPLANT
SUT VIC AB 1 CT1 27XBRD ANTBC (SUTURE) IMPLANT
SUT VIC AB 1-0 CT2 27 (SUTURE) IMPLANT
SUT VIC AB 2-0 CT1 27 (SUTURE)
SUT VIC AB 2-0 CT1 TAPERPNT 27 (SUTURE) IMPLANT
SUT VIC AB 2-0 CT2 27 (SUTURE) ×2 IMPLANT
SYR 3ML LL SCALE MARK (SYRINGE) ×2 IMPLANT
TOWEL GREEN STERILE (TOWEL DISPOSABLE) ×2 IMPLANT
TOWEL GREEN STERILE FF (TOWEL DISPOSABLE) ×2 IMPLANT
YANKAUER SUCT BULB TIP NO VENT (SUCTIONS) ×2 IMPLANT

## 2018-09-24 NOTE — Brief Op Note (Signed)
09/24/2018  9:37 AM  PATIENT:  Donna Christian  83 y.o. female  PRE-OPERATIVE DIAGNOSIS:  Herniated Nucleus Pulposus Lumbar Four-Five Right  POST-OPERATIVE DIAGNOSIS:  Herniated Nucleus Pulposus Lumbar Four-Five Right  PROCEDURE:  Procedure(s): Microlumbar decompression Lumbar Four-Five  SURGEON:  Surgeon(s) and Role:    Susa Day, MD - Primary  PHYSICIAN ASSISTANT:   ASSISTANTS: [place]   ANESTHESIA:   general  EBL:  50 mL   BLOOD ADMINISTERED:none  DRAINS: none   LOCAL MEDICATIONS USED:  MARCAINE     SPECIMEN:  No Specimen  DISPOSITION OF SPECIMEN:  N/A  COUNTS:  YES  TOURNIQUET:  * No tourniquets in log *  DICTATION: .Other Dictation: Dictation Number [id number]  PLAN OF CARE: Admit for overnight observation  PATIENT DISPOSITION:  PACU - hemodynamically stable.   Delay start of Pharmacological VTE agent (>24hrs) due to surgical blood loss or risk of bleeding: yes

## 2018-09-24 NOTE — Interval H&P Note (Signed)
History and Physical Interval Note:  09/24/2018 7:20 AM  Donna Christian  has presented today for surgery, with the diagnosis of HNP L4-5 Right  The various methods of treatment have been discussed with the patient and family. After consideration of risks, benefits and other options for treatment, the patient has consented to  Procedure(s) with comments: Microlumbar decompression L4-5 Right (Right) - 90 mins as a surgical intervention .  The patient's history has been reviewed, patient examined, no change in status, stable for surgery.  I have reviewed the patient's chart and labs.  Questions were answered to the patient's satisfaction.     Damarius Karnes C

## 2018-09-24 NOTE — Anesthesia Procedure Notes (Signed)
Procedure Name: Intubation Date/Time: 09/24/2018 7:33 AM Performed by: Inda Coke, CRNA Pre-anesthesia Checklist: Patient identified, Emergency Drugs available, Suction available and Patient being monitored Patient Re-evaluated:Patient Re-evaluated prior to induction Oxygen Delivery Method: Circle System Utilized Preoxygenation: Pre-oxygenation with 100% oxygen Induction Type: IV induction and Cricoid Pressure applied Ventilation: Mask ventilation without difficulty Laryngoscope Size: Mac and 3 Grade View: Grade I Tube type: Oral Number of attempts: 1 Airway Equipment and Method: Stylet and Oral airway Placement Confirmation: ETT inserted through vocal cords under direct vision,  positive ETCO2 and breath sounds checked- equal and bilateral Secured at: 21 cm Tube secured with: Tape Dental Injury: Teeth and Oropharynx as per pre-operative assessment

## 2018-09-24 NOTE — Discharge Instructions (Signed)

## 2018-09-24 NOTE — Transfer of Care (Signed)
Immediate Anesthesia Transfer of Care Note  Patient: Donna Christian  Procedure(s) Performed: Microlumbar decompression Lumbar Four-Five (Spine Lumbar)  Patient Location: PACU  Anesthesia Type:General  Level of Consciousness: awake and drowsy  Airway & Oxygen Therapy: Patient Spontanous Breathing and Patient connected to nasal cannula oxygen  Post-op Assessment: Report given to RN and Post -op Vital signs reviewed and stable  Post vital signs: Reviewed and stable  Last Vitals:  Vitals Value Taken Time  BP 145/78 09/24/2018  9:48 AM  Temp    Pulse 61 09/24/2018  9:48 AM  Resp 12 09/24/2018  9:48 AM  SpO2 98 % 09/24/2018  9:48 AM  Vitals shown include unvalidated device data.  Last Pain:  Vitals:   09/24/18 0640  TempSrc:   PainSc: 6          Complications: No apparent anesthesia complications

## 2018-09-24 NOTE — Op Note (Signed)
NAMEDENISHIA, CITRO MEDICAL RECORD RS:[id number] ACCOUNT [id number] DATE OF BIRTH:1934/12/29 FACILITY: [facility] LOCATION: [facility] PHYSICIAN:Danell Vazquez Windy Kalata, MD  OPERATIVE REPORT  DATE OF PROCEDURE:  09/24/2018  PREOPERATIVE DIAGNOSIS:  Spinal stenosis, herniated nucleus pulposus L4-5.  POSTOPERATIVE DIAGNOSIS:  Spinal stenosis, herniated nucleus pulposus L4-5.  PROCEDURE PERFORMED:   1.  Central microlumbar decompression L4-L5 with bilateral hemilaminotomies. 2.  Foraminotomy of 4 and 5 on the right.   3.  Microdiskectomy 4-5 right.  ANESTHESIA:  General.  SURGEON:  Susa Day, MD  ASSISTANT:  Lacie Draft, PA  HISTORY:  An 83 year old with disk herniation, scoliosis, migrating cephalad out into the foramen compressing the 4 root.  She had neural tension signs, quad weakness tibialis anterior weakness indicated for microlumbar decompression failing conservative  treatment including therapy, injections, activity modification.  Discussed decompression.  Risks and benefits discussed including bleeding, infection, damage to neurovascular structures, no change in symptoms, worsening symptoms, DVT, PE, anesthetic  complications, etc.  DESCRIPTION OF PROCEDURE:  With the patient in supine position after induction of adequate general anesthesia, 2 grams Kefzol.  She was placed prone on the [place] table.  All bony prominences well padded.  Lumbar region was prepped and draped in the  usual sterile fashion.  Two 18-gauge spinal needles were utilized to localize 4-5 interspace confirmed with x-ray.  Incision was made from the spinous process of 4-5 subcutaneous tissue was dissected with electrocautery to achieve hemostasis.  Dorsal  lumbar fascia divided in line with the skin incision.  Paraspinous muscles elevated from the lamina 4-5 bilaterally after infiltration with 0.25% Marcaine with epinephrine.  The McCullough retractor was placed.  Confirmatory radiograph obtained.  The  operating  microscope was draped and brought into the surgical field.  I removed the spinous processes partially at 4 and 5 as there was a very small interlaminar window and apposition of the spinous processes.  Following that, I used a micro curette to  detach ligamentum flavum from the cephalad edge of 5 bilaterally and then the caudad edge of 4.  Essentially, we performed a bilateral hemilaminotomy started centrally first with a 2 mm Kerrison to the point detaching the ligamentum flavum and the mid  and to the right and also in the hemilamina on the left.  Ligamentum flavum was removed from the interspace; however, there is some significant adherence to the right, extending down into the lateral recess.  I detached ligamentum flavum from the  cephalad edge of 5.  I utilized a micro curette on the right.  Preserving the pars, we decompressed the lateral recess meticulously utilizing FA-20 curette just to detach ligamentum flavum from the remaining lamina.  I identified the thecal sac just  cephalad to the foramen 4-5 on the right as the disk herniation was noted to be into the foramen.  There were adhesions of the lateral aspect of the thecal sac and the 4 root to the pedicle and to the lamina.  We meticulously and carefully developed a  plane between that thecal sac and the bony lamina and pedicle.  As we tracked out to the foramen, I performed a foraminotomy of 4, preserving the pars.  This was performed with a FA-20 micro curette detached the ligamentum flavum a thin 2 mm Kerrison.  I  also decompressed the lateral recess to the medial border of the pedicle of 5 utilizing the FA-20 microcurette protecting the lateral recess and the 5 root, which was noted to be severely impinged into the lateral recess.  We  used a neuro patty to  protect the elements.  After freeing up the lateral aspect of the shoulder of the 4 root and protecting the 4 root, I used a Penfield 4 to use a window to get to the disk that was beneath  the 4 root.  We used a micro nerve hook to remove 2 small  fragments.  The remainder of the disk protrusion was fairly hard and non-removable.  I felt that performing a foraminotomy and the small fragments that were removed adequately produced room for the L4 nerve root as a Woodson retractor passed freely out  the foramen of 4 and 5.  There were some adhesions and the residual of epidural steroid injections that were noted laterally as well.  Following this, confirmatory radiograph obtained.  We used bipolar cautery to achieve hemostasis.  There were some  epidural veins that were noted as well.  Bone wax was placed on the cancellous surfaces.  A confirmatory radiograph obtained with an instrument in the foramen of 4-5.  I thought it felt well decompressed and there was a fair amount of tethering out  laterally of the 4 root that we probed it out to the lateral aspect of the pedicle of 4.  The nerve root head was noted initially to be fairly compressed out into the foramen both by disk and the facet ligamentum flavum hypertrophy.  Again, following  that there was ample room for the 4 root and the 5 root.  No CSF leakage or active bleeding.  Copiously irrigated the wound.  One small patty of thrombin-soaked Gelfoam was placed lateral to the shoulder of the 4 root.  I removed the McCullough  retractor.  Paraspinous muscles inspected and cauterized with no active bleeding.  I closed the dorsal lumbar fascia #1 Vicryl, subcutaneous with 2-0 and skin with Prolene.  Sterile dressing applied.  Placed supine on the hospital bed, extubated without  difficulty and transported to the recovery room in satisfactory condition.  The patient tolerated the procedure well.  No complications.  Assistant was [employer], [place].  Blood loss was 50 mL.  TN/NUANCE  D:09/24/2018 T:09/24/2018 JOB:[id number]

## 2018-09-24 NOTE — Anesthesia Postprocedure Evaluation (Signed)
Anesthesia Post Note  Patient: Sharhonda Keithly  Procedure(s) Performed: Microlumbar decompression Lumbar Four-Five (Spine Lumbar)     Patient location during evaluation: PACU Anesthesia Type: General Level of consciousness: awake and alert Pain management: pain level controlled Vital Signs Assessment: post-procedure vital signs reviewed and stable Respiratory status: spontaneous breathing, nonlabored ventilation, respiratory function stable and patient connected to nasal cannula oxygen Cardiovascular status: blood pressure returned to baseline and stable Postop Assessment: no apparent nausea or vomiting Anesthetic complications: no    Last Vitals:  Vitals:   09/24/18 1035 09/24/18 1359  BP: 124/72 (!) 122/58  Pulse: 63 64  Resp: 18   Temp: (!) 36.4 C 36.7 C  SpO2: 97% 94%    Last Pain:  Vitals:   09/24/18 1359  TempSrc: Oral  PainSc:                  Isacc Turney L Rhodes Calvert

## 2018-09-25 ENCOUNTER — Encounter (HOSPITAL_COMMUNITY): Payer: Self-pay | Admitting: Specialist

## 2018-09-25 DIAGNOSIS — M48061 Spinal stenosis, lumbar region without neurogenic claudication: Secondary | ICD-10-CM | POA: Diagnosis not present

## 2018-09-25 LAB — BASIC METABOLIC PANEL
Anion gap: 6 (ref 5–15)
BUN: 17 mg/dL (ref 8–23)
CALCIUM: 8.5 mg/dL — AB (ref 8.9–10.3)
CO2: 28 mmol/L (ref 22–32)
Chloride: 106 mmol/L (ref 98–111)
Creatinine, Ser: 0.75 mg/dL (ref 0.44–1.00)
Glucose, Bld: 114 mg/dL — ABNORMAL HIGH (ref 70–99)
Potassium: 3.6 mmol/L (ref 3.5–5.1)
SODIUM: 140 mmol/L (ref 135–145)

## 2018-09-25 MED FILL — Thrombin (Recombinant) For Soln 20000 Unit: CUTANEOUS | Qty: 1 | Status: AC

## 2018-09-25 NOTE — Progress Notes (Signed)
Subjective: 1 Day Post-Op Procedure(s): Microlumbar decompression Lumbar Four-Five Patient reports pain as mild.   Reports leg pain resolved. Noting soreness in back. No N/V, CP, SOB. No difficulty voiding. Feels able to go home today.  Objective: Vital signs in last 24 hours: Temp:  [97.5 F (36.4 C)-98.8 F (37.1 C)] 98.2 F (36.8 C) (11/01 0718) Pulse Rate:  [59-70] 63 (11/01 0718) Resp:  [13-18] 16 (11/01 0718) BP: (112-147)/(54-78) 147/66 (11/01 0718) SpO2:  [94 %-99 %] 98 % (11/01 0718)  Intake/Output from previous day: 10/31 0701 - 11/01 0700 In: 1640 [P.O.:240; I.V.:1200; IV Piggyback:200] Out: 53 [Urine:3; Blood:50] Intake/Output this shift: No intake/output data recorded.  No results for input(s): HGB in the last 72 hours. No results for input(s): WBC, RBC, HCT, PLT in the last 72 hours. Recent Labs    09/25/18 0411  NA 140  K 3.6  CL 106  CO2 28  BUN 17  CREATININE 0.75  GLUCOSE 114*  CALCIUM 8.5*   No results for input(s): LABPT, INR in the last 72 hours.  Neurologically intact ABD soft Neurovascular intact Sensation intact distally Intact pulses distally Dorsiflexion/Plantar flexion intact Incision: dressing C/D/I and no drainage No cellulitis present Compartment soft no calf pain or sign of DVT  Assessment/Plan: 1 Day Post-Op Procedure(s): Microlumbar decompression Lumbar Four-Five Advance diet Up with therapy D/C IV fluids Discussed D/C instructions, Lspine precautions, dressing instructions D/C home today Will discuss with Dr. Tonita Cong See as outpt in 2 weeks suture removal   BISSELL, JACLYN M. 09/25/2018, 7:50 AM

## 2018-09-25 NOTE — Evaluation (Signed)
Physical Therapy Evaluation Patient Details Name: Donna Christian MRN: [id number] DOB: 05-15-1935 Today's Date: 09/25/2018   History of Present Illness  This 83 y.o. admitted with HNP L4-5, and underwent microlumbar discompression L4-5.   PMH includes:  HTN  Clinical Impression  Patient evaluated by Physical Therapy with no further acute PT needs identified. Ambulating hallway distances without an assistive device with supervision. Recommended cane for balance, especially over unlevel surfaces. Negotiated 4 steps with railing to prepare for discharge home. Educated patient on generalized walking program and work modifications. All education has been completed and the patient has no further questions. No follow-up Physical Therapy or equipment needs. PT is signing off. Thank you for this referral.     Follow Up Recommendations No PT follow up    Equipment Recommendations  None recommended by PT    Recommendations for Other Services       Precautions / Restrictions Precautions Precautions: Back Precaution Booklet Issued: Yes (comment) Precaution Comments: Verbally reviewed and provided written handout Restrictions Weight Bearing Restrictions: No      Mobility  Bed Mobility Overal bed mobility: Modified Independent             General bed mobility comments: Cues for log roll technique  Transfers Overall transfer level: Independent Equipment used: None                Ambulation/Gait Ambulation/Gait assistance: Supervision Gait Distance (Feet): 450 Feet Assistive device: None Gait Pattern/deviations: Step-through pattern;Wide base of support Gait velocity: decreased   General Gait Details: Pt with left foot external rotation which she states is baseline. Occasionally reaching for railing for additional external support  Stairs Stairs: Yes Stairs assistance: Min guard Stair Management: One rail Right Number of Stairs: 4 General stair comments: Step over step  technique with min guard assist.   Wheelchair Mobility    Modified Rankin (Stroke Patients Only)       Balance Overall balance assessment: Mild deficits observed, not formally tested                                           Pertinent Vitals/Pain Pain Assessment: Faces Faces Pain Scale: Hurts a little bit Pain Location: incisional Pain Descriptors / Indicators: Sore Pain Intervention(s): Monitored during session    Home Living Family/patient expects to be discharged to:: Private residence Living Arrangements: Spouse/significant other Available Help at Discharge: Family Type of Home: House Home Access: Stairs to enter Entrance Stairs-Rails: None Entrance Stairs-Number of Steps: 2 Home Layout: One level Home Equipment: Cane - single point      Prior Function Level of Independence: Independent with assistive device(s)         Comments: Use of cane for mobility since September. Works as a Product/process development scientist Extremity Assessment Upper Extremity Assessment: Defer to OT evaluation    Lower Extremity Assessment Lower Extremity Assessment: RLE deficits/detail;LLE deficits/detail RLE Deficits / Details: Strength 5/5 LLE Deficits / Details: Strength 5/5    Cervical / Trunk Assessment Cervical / Trunk Assessment: Other exceptions Cervical / Trunk Exceptions: s/p microlumbar decompression  Communication   Communication: No difficulties  Cognition Arousal/Alertness: Awake/alert Behavior During Therapy: WFL for tasks assessed/performed Overall Cognitive Status: Within Functional Limits for tasks assessed  General Comments      Exercises     Assessment/Plan    PT Assessment Patent does not need any further PT services  PT Problem List         PT Treatment Interventions      PT Goals (Current goals can be found in the Care Plan  section)  Acute Rehab PT Goals Patient Stated Goal: "return to work part time" PT Goal Formulation: All assessment and education complete, DC therapy    Frequency     Barriers to discharge        Co-evaluation               AM-PAC PT "6 Clicks" Daily Activity  Outcome Measure Difficulty turning over in bed (including adjusting bedclothes, sheets and blankets)?: None Difficulty moving from lying on back to sitting on the side of the bed? : None Difficulty sitting down on and standing up from a chair with arms (e.g., wheelchair, bedside commode, etc,.)?: None Help needed moving to and from a bed to chair (including a wheelchair)?: None Help needed walking in hospital room?: A Little Help needed climbing 3-5 steps with a railing? : A Little 6 Click Score: 22    End of Session Equipment Utilized During Treatment: Gait belt Activity Tolerance: Patient tolerated treatment well Patient left: in bed;with call bell/phone within reach Nurse Communication: Mobility status PT Visit Diagnosis: Unsteadiness on feet (R26.81);Difficulty in walking, not elsewhere classified (R26.2);Pain Pain - part of body: (back)    Time: 1025-8527 PT Time Calculation (min) (ACUTE ONLY): 14 min   Charges:   PT Evaluation $PT Eval Low Complexity: [place], PT, DPT Acute Rehabilitation Services Pager [phone] Office [phone]   Willy Eddy 09/25/2018, 10:34 AM

## 2018-09-25 NOTE — Evaluation (Signed)
Occupational Therapy Evaluation Patient Details Name: Donna Christian MRN: [id number] DOB: 15-Nov-1935 Today's Date: 09/25/2018    History of Present Illness This 83 y.o. admitted with HNP L4-5, and underwent microlumbar discompression L4-5.   PMH includes:  HTN   Clinical Impression   Patient evaluated by Occupational Therapy with no further acute OT needs identified. All education has been completed and the patient has no further questions. Pt instructed in back precautions and safety with ADLs.  She is able to perform ADLs with supervision.   See below for any follow-up Occupational Therapy or equipment needs. OT is signing off. Thank you for this referral.      Follow Up Recommendations  No OT follow up;Supervision - Intermittent    Equipment Recommendations  None recommended by OT    Recommendations for Other Services       Precautions / Restrictions Precautions Precautions: Back Precaution Booklet Issued: Yes (comment) Precaution Comments: Pt able to state back precautions, but requires min cues to avoid twisting  Restrictions Weight Bearing Restrictions: No      Mobility Bed Mobility Overal bed mobility: Modified Independent             General bed mobility comments: Cues for log roll technique  Transfers Overall transfer level: Independent Equipment used: None                  Balance Overall balance assessment: Mild deficits observed, not formally tested                                         ADL either performed or assessed with clinical judgement   ADL Overall ADL's : Needs assistance/impaired Eating/Feeding: Independent   Grooming: Wash/dry hands;Wash/dry face;Oral care;Brushing hair;Supervision/safety;Standing Grooming Details (indicate cue type and reason): Pt instructed in safe technique for oral care  Upper Body Bathing: Supervision/ safety;Standing   Lower Body Bathing: Supervison/ safety;Sit to/from stand Lower Body  Bathing Details (indicate cue type and reason): discussed ues of LH brush/sponge, or sitting to wash feet  Upper Body Dressing : Supervision/safety;Sitting   Lower Body Dressing: Supervision/safety;Sit to/from stand Lower Body Dressing Details (indicate cue type and reason): Pt is able to cross ankles over knees  Toilet Transfer: Supervision/safety;Ambulation;Comfort height toilet   Toileting- Clothing Manipulation and Hygiene: Supervision/safety;Sit to/from stand   Tub/ Shower Transfer: Walk-in shower;Supervision/safety;Ambulation   Functional mobility during ADLs: Supervision/safety General ADL Comments: reviewed back precautions and safety with ADLs and IADLs.  Spouse will assist with IADLs.  Instructed pt to discuss return to work with MD, and to avoid sitting for longer than 45 mins - 1 hour at a time      Vision         Perception     Praxis      Pertinent Vitals/Pain Pain Assessment: Faces Faces Pain Scale: Hurts a little bit Pain Location: incisional Pain Descriptors / Indicators: Sore Pain Intervention(s): Monitored during session     Hand Dominance Right   Extremity/Trunk Assessment Upper Extremity Assessment Upper Extremity Assessment: Overall WFL for tasks assessed   Lower Extremity Assessment Lower Extremity Assessment: RLE deficits/detail;LLE deficits/detail RLE Deficits / Details: Strength 5/5 LLE Deficits / Details: Strength 5/5   Cervical / Trunk Assessment Cervical / Trunk Assessment: Other exceptions Cervical / Trunk Exceptions: s/p microlumbar decompression   Communication Communication Communication: No difficulties   Cognition Arousal/Alertness: Awake/alert Behavior During Therapy: WFL for  tasks assessed/performed Overall Cognitive Status: Within Functional Limits for tasks assessed                                     General Comments       Exercises     Shoulder Instructions      Home Living Family/patient expects to  be discharged to:: Private residence Living Arrangements: Spouse/significant other Available Help at Discharge: Family Type of Home: House Home Access: Stairs to enter Technical brewer of Steps: 2 Entrance Stairs-Rails: None Home Layout: One level     Bathroom Shower/Tub: Teacher, early years/pre: [place]: [place] - single point          Prior Functioning/Environment Level of Independence: Independent with assistive device(s)        Comments: Use of cane for mobility since September. Works as a Secondary school teacher Problem List: Decreased activity tolerance;Decreased knowledge of precautions;Pain      OT Treatment/Interventions:      OT Goals(Current goals can be found in the care plan section) Acute Rehab OT Goals Patient Stated Goal: to go back to work  OT Goal Formulation: All assessment and education complete, DC therapy  OT Frequency:     Barriers to D/C:            Co-evaluation              AM-PAC PT "6 Clicks" Daily Activity     Outcome Measure Help from another person eating meals?: None Help from another person taking care of personal grooming?: None Help from another person toileting, which includes using toliet, bedpan, or urinal?: None Help from another person bathing (including washing, rinsing, drying)?: None Help from another person to put on and taking off regular upper body clothing?: None Help from another person to put on and taking off regular lower body clothing?: None 6 Click Score: 24   End of Session Nurse Communication: Mobility status  Activity Tolerance: Patient tolerated treatment well Patient left: in bed;with call bell/phone within reach;with family/visitor present(sitting EOB q)  OT Visit Diagnosis: Pain Pain - part of body: (back )                Time: 2376-2831 OT Time Calculation (min): 8 min Charges:  OT General Charges $OT Visit: 1 Visit OT Evaluation $OT Eval Low Complexity:  1 Low  Lucille Passy, OTR/L Acute Rehabilitation Services Pager [phone] Office [phone]   Lucille Passy M 09/25/2018, 11:43 AM

## 2018-09-25 NOTE — Discharge Summary (Signed)
Physician Discharge Summary   Patient ID: Donna Christian MRN: [id number] DOB/AGE: 1935/07/06 83 y.o.  Admit date: 09/24/2018 Discharge date: 09/25/2018  Primary Diagnosis:   Herniated Nucleus Pulposus Lumbar Four-Five Right  Admission Diagnoses:  Past Medical History:  Diagnosis Date  . Hypertension    Discharge Diagnoses:   Active Problems:   Spinal stenosis at L4-L5 level  Procedure:  Procedure(s): Microlumbar decompression Lumbar Four-Five   Consults: None  HPI:  see H&P    Laboratory Data: Hospital Outpatient Visit on 09/17/2018  Component Date Value Ref Range Status  . Sodium 09/17/2018 143  135 - 145 mmol/L Final  . Potassium 09/17/2018 3.4* 3.5 - 5.1 mmol/L Final  . Chloride 09/17/2018 109  98 - 111 mmol/L Final  . CO2 09/17/2018 29  22 - 32 mmol/L Final  . Glucose, Bld 09/17/2018 113* 70 - 99 mg/dL Final  . BUN 09/17/2018 24* 8 - 23 mg/dL Final  . Creatinine, Ser 09/17/2018 0.81  0.44 - 1.00 mg/dL Final  . Calcium 09/17/2018 8.7* 8.9 - 10.3 mg/dL Final  . GFR calc non Af Amer 09/17/2018 >60  >60 mL/min Final  . GFR calc Af Amer 09/17/2018 >60  >60 mL/min Final   Comment: (NOTE) The eGFR has been calculated using the CKD EPI equation. This calculation has not been validated in all clinical situations. eGFR's persistently <60 mL/min signify possible Chronic Kidney Disease.   Georgiann Hahn gap 09/17/2018 5  5 - 15 Final   Performed at [facility], [place] [address], [place], [place] [address]  . WBC 09/17/2018 8.9  4.0 - 10.5 K/uL Final  . RBC 09/17/2018 4.90  3.87 - 5.11 MIL/uL Final  . Hemoglobin 09/17/2018 14.2  12.0 - 15.0 g/dL Final  . HCT 09/17/2018 43.0  36.0 - 46.0 % Final  . MCV 09/17/2018 87.8  80.0 - 100.0 fL Final  . MCH 09/17/2018 29.0  26.0 - 34.0 pg Final  . MCHC 09/17/2018 33.0  30.0 - 36.0 g/dL Final  . RDW 09/17/2018 13.6  11.5 - 15.5 % Final  . Platelets 09/17/2018 348  150 - 400 K/uL Final  . nRBC 09/17/2018 0.0  0.0 - 0.2 % Final   Performed at [facility], [place] [address] [place], [place] [address]  . MRSA, PCR 09/17/2018 NEGATIVE  NEGATIVE Final  . Staphylococcus aureus 09/17/2018 NEGATIVE  NEGATIVE Final   Comment: (NOTE) The Xpert SA Assay (FDA approved for NASAL specimens in patients 29 years of age and older), is one component of a comprehensive surveillance program. It is not intended to diagnose infection nor to guide or monitor treatment. Performed at [facility], [place] [address], [place], [place] [address]    No results for input(s): HGB in the last 72 hours. No results for input(s): WBC, RBC, HCT, PLT in the last 72 hours. Recent Labs    09/25/18 0411  NA 140  K 3.6  CL 106  CO2 28  BUN 17  CREATININE 0.75  GLUCOSE 114*  CALCIUM 8.5*   No results for input(s): LABPT, INR in the last 72 hours.  X-Rays:Dg Lumbar Spine 2-3 Views  Result Date: 09/17/2018 CLINICAL DATA:  Disc protrusion EXAM: LUMBAR SPINE - 2-3 VIEW COMPARISON:  None. FINDINGS: There is 15 degrees of dextroconvex scoliosis between T12 and L4, with moderate rotary component. Bony demineralization. 3 mm of degenerative retrolisthesis at L2-3 and 3 mm degenerative anterolisthesis at L4-5. No lumbar fracture is identified. Aortoiliac atherosclerotic vascular disease.  Prominent stool throughout the colon favors constipation. IMPRESSION: 1. Dextroconvex lumbar scoliosis with rotary component. 2. Bony demineralization, without appreciable fracture. 3. Mild degenerative subluxations at L2-3 and L4-5. 4.  Aortic Atherosclerosis (ICD10-I70.0). 5.  Prominent stool throughout the colon favors constipation. Electronically Signed   By: Van Clines M.D.   On: 09/17/2018 16:23    EKG: Orders placed or performed during the hospital encounter of 09/17/18  . EKG 12 lead  . EKG 12 lead     Hospital Course: Patient was admitted to [facility] and taken to the OR and underwent the above state procedure without complications.   Patient tolerated the procedure well and was later transferred to the recovery room and then to the orthopaedic floor for postoperative care.  They were given PO and IV analgesics for pain control following their surgery.  They were given 24 hours of postoperative antibiotics.   PT was consulted postop to assist with mobility and transfers.  The patient was allowed to be WBAT with therapy and was taught back precautions. Discharge planning was consulted to help with postop disposition and equipment needs.  Patient had a good night on the evening of surgery and started to get up OOB with therapy on day one. Patient was seen in rounds and was ready to go home on day one.  They were given discharge instructions and dressing directions.  They were instructed on when to follow up in the office with Dr. Tonita Cong.   Diet: Regular diet Activity:WBAT, Lspine precautions Follow-up:in 10-14 days Disposition - Home Discharged Condition: good   Discharge Instructions    Call MD / Call 911   Complete by:  As directed    If you experience chest pain or shortness of breath, CALL 911 and be transported to the hospital emergency room.  If you develope a fever above 101 F, pus (white drainage) or increased drainage or redness at the wound, or calf pain, call your surgeon's office.   Constipation Prevention   Complete by:  As directed    Drink plenty of fluids.  Prune juice may be helpful.  You may use a stool softener, such as Colace (over the counter) 100 mg twice a day.  Use MiraLax (over the counter) for constipation as needed.   Diet - low sodium heart healthy   Complete by:  As directed    Increase activity slowly as tolerated   Complete by:  As directed      Allergies as of 09/25/2018   No Known Allergies     Medication List    TAKE these medications   alendronate 70 MG tablet Commonly known as:  FOSAMAX Take 70 mg by mouth every Friday. Take with a full glass of water on an empty stomach.     amLODipine 10 MG tablet Commonly known as:  NORVASC Take 10 mg by mouth daily.   CALCIUM 600/VITAMIN D3 PO Take 1 tablet by mouth daily.   docusate sodium 100 MG capsule Commonly known as:  COLACE Take 1 capsule (100 mg total) by mouth 2 (two) times daily.   losartan-hydrochlorothiazide 100-12.5 MG tablet Commonly known as:  HYZAAR Take 1 tablet by mouth daily.   metoprolol succinate 50 MG 24 hr tablet Commonly known as:  TOPROL-XL Take 50 mg by mouth daily. Take with or immediately following a meal.   naproxen sodium 220 MG tablet Commonly known as:  ALEVE Take 2 tablets (440 mg total) by mouth daily. Resume 5 days post-op as  needed What changed:  additional instructions   oxyCODONE 5 MG immediate release tablet Commonly known as:  Oxy IR/ROXICODONE Take 1 tablet (5 mg total) by mouth every 4 (four) hours as needed.   polyethylene glycol packet Commonly known as:  MIRALAX / GLYCOLAX Take 17 g by mouth daily.   traMADol 50 MG tablet Commonly known as:  ULTRAM Take 50 mg by mouth at bedtime.   VITAMIN D3 PO Take 1 capsule by mouth daily.      Follow-up Information    Susa Day, MD Follow up in 2 week(s).   Specialty:  Orthopedic Surgery Contact information: [address] [place] [address] [phone]           Signed: Lacie Draft, PA-C Orthopaedic Surgery 09/25/2018, 7:52 AM

## 2018-09-25 NOTE — Progress Notes (Signed)
Pt received discharged instructions.  VSS.  Pt discharge home with family at bedside.

## 2019-03-23 IMAGING — CR DG LUMBAR SPINE 2-3V
3 series · 3 of 3 positions shown · non-contrast
Comparison: none

[lateral (1 of 3)]
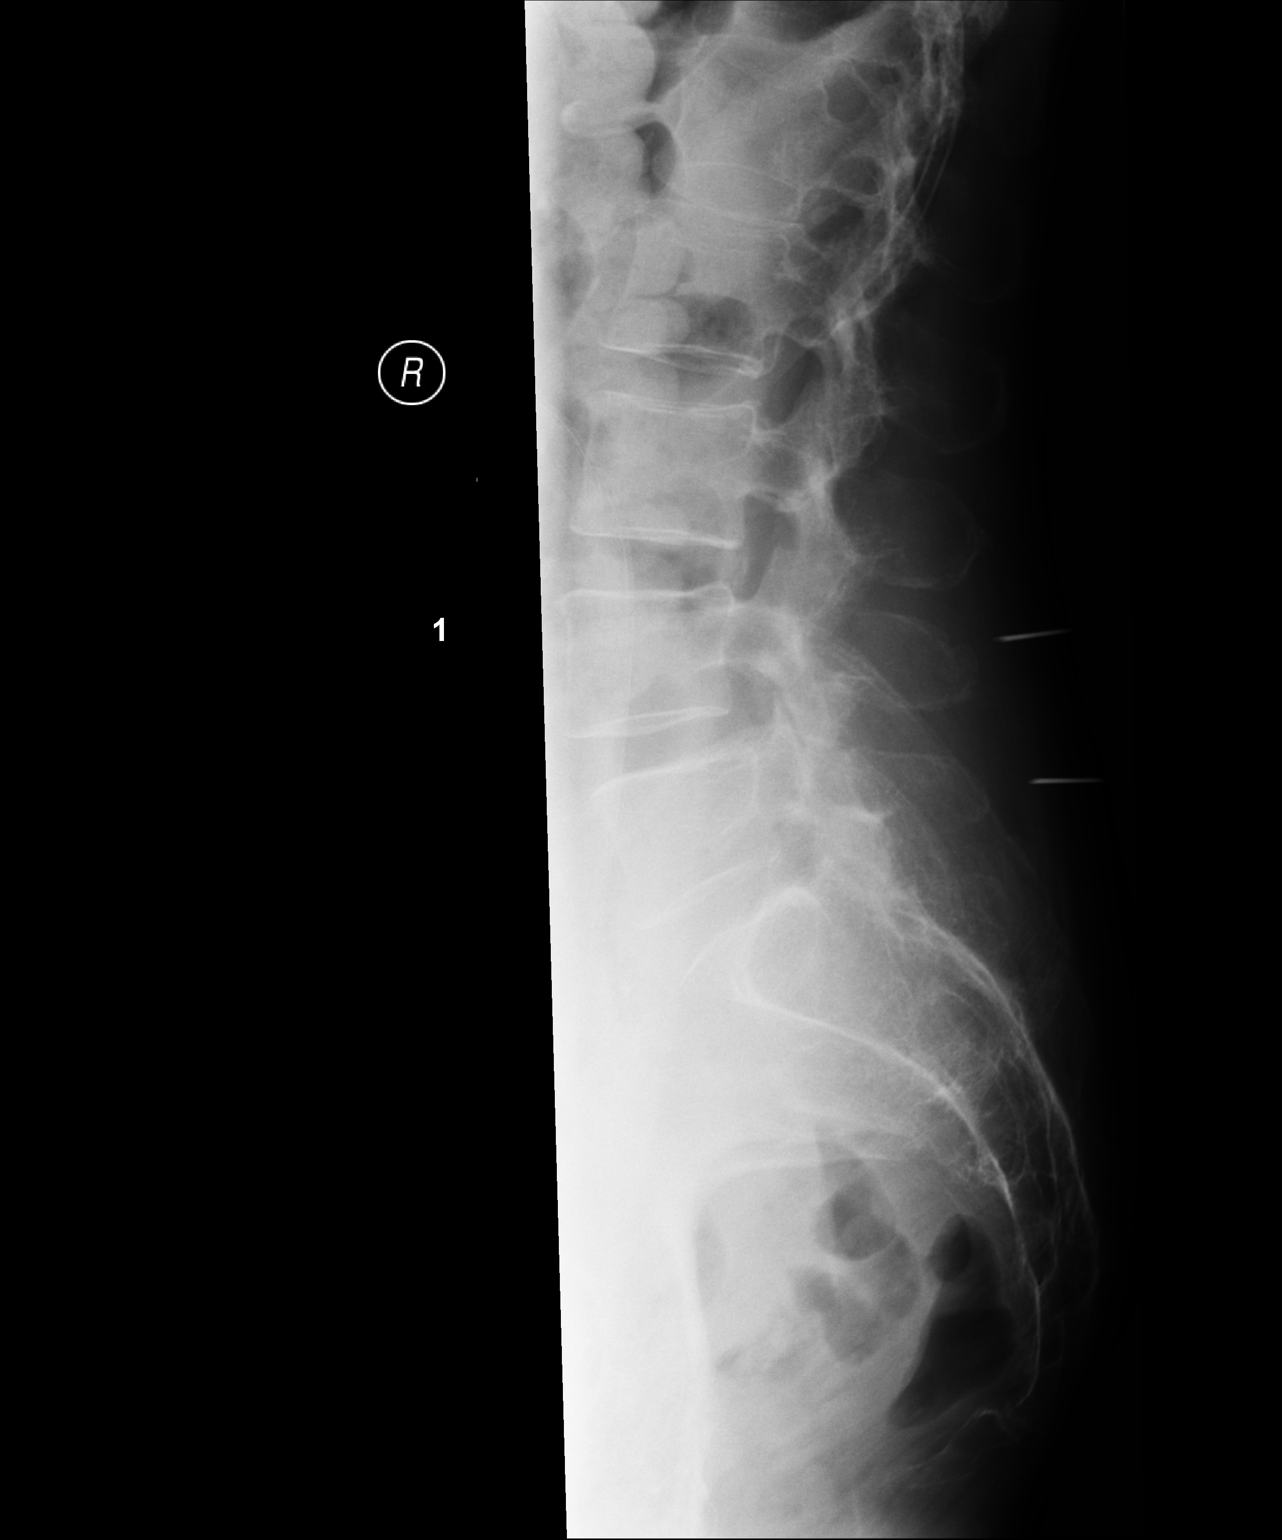

[lateral (2 of 3)]
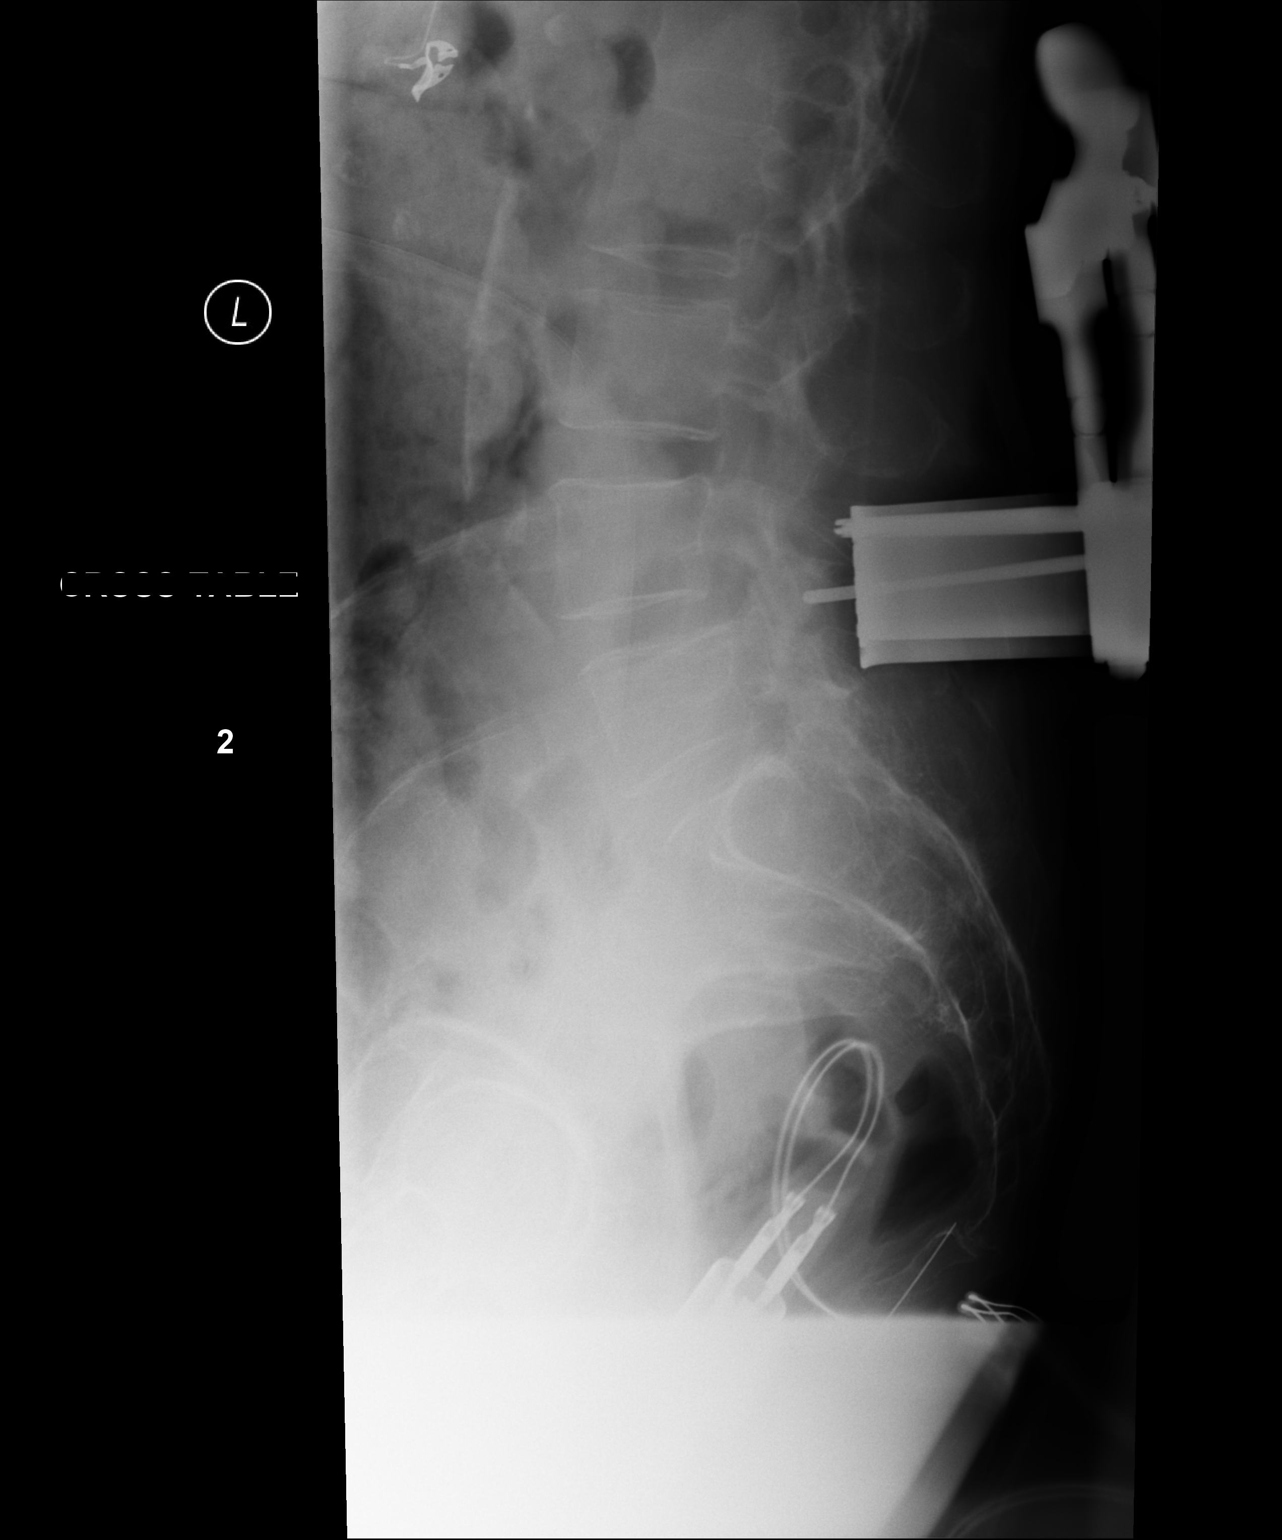

[lateral (3 of 3)]
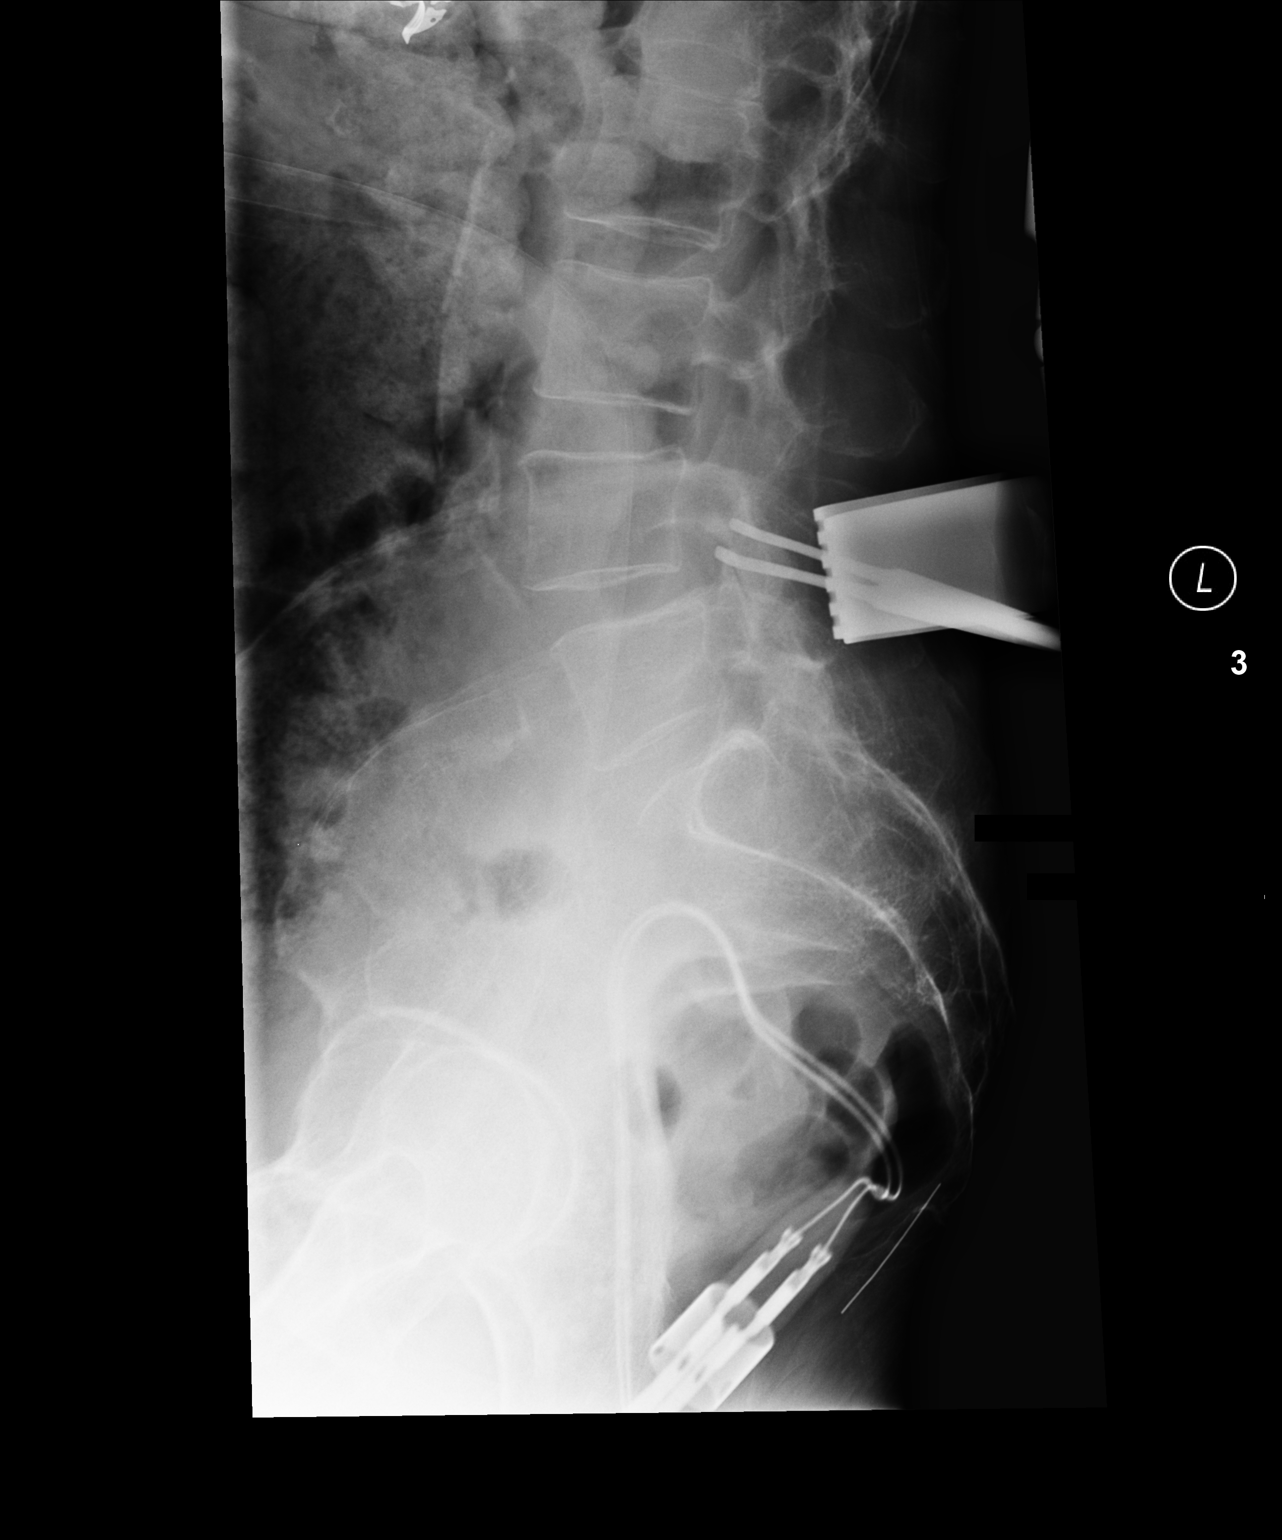

[3 of 3 positions shown; findings below may reference images not displayed]

Canned report from images found in remote index.

Refer to host system for actual result text.

## 2021-03-28 DIAGNOSIS — I10 Essential (primary) hypertension: Secondary | ICD-10-CM | POA: Diagnosis not present

## 2021-03-28 DIAGNOSIS — M810 Age-related osteoporosis without current pathological fracture: Secondary | ICD-10-CM | POA: Diagnosis not present

## 2021-05-22 DIAGNOSIS — M810 Age-related osteoporosis without current pathological fracture: Secondary | ICD-10-CM | POA: Diagnosis not present

## 2021-05-22 DIAGNOSIS — I10 Essential (primary) hypertension: Secondary | ICD-10-CM | POA: Diagnosis not present

## 2021-06-12 DIAGNOSIS — D3132 Benign neoplasm of left choroid: Secondary | ICD-10-CM | POA: Diagnosis not present

## 2021-06-22 DIAGNOSIS — I10 Essential (primary) hypertension: Secondary | ICD-10-CM | POA: Diagnosis not present

## 2021-07-04 DIAGNOSIS — R7301 Impaired fasting glucose: Secondary | ICD-10-CM | POA: Diagnosis not present

## 2021-07-04 DIAGNOSIS — Z0000 Encounter for general adult medical examination without abnormal findings: Secondary | ICD-10-CM | POA: Diagnosis not present

## 2021-07-04 DIAGNOSIS — I10 Essential (primary) hypertension: Secondary | ICD-10-CM | POA: Diagnosis not present

## 2021-07-04 DIAGNOSIS — Z1389 Encounter for screening for other disorder: Secondary | ICD-10-CM | POA: Diagnosis not present

## 2021-07-04 DIAGNOSIS — M810 Age-related osteoporosis without current pathological fracture: Secondary | ICD-10-CM | POA: Diagnosis not present

## 2021-07-04 DIAGNOSIS — Z6824 Body mass index (BMI) 24.0-24.9, adult: Secondary | ICD-10-CM | POA: Diagnosis not present

## 2021-07-04 DIAGNOSIS — R7309 Other abnormal glucose: Secondary | ICD-10-CM | POA: Diagnosis not present

## 2021-07-13 DIAGNOSIS — I10 Essential (primary) hypertension: Secondary | ICD-10-CM | POA: Diagnosis not present

## 2021-07-13 DIAGNOSIS — M810 Age-related osteoporosis without current pathological fracture: Secondary | ICD-10-CM | POA: Diagnosis not present

## 2021-07-24 DIAGNOSIS — I10 Essential (primary) hypertension: Secondary | ICD-10-CM | POA: Diagnosis not present

## 2021-08-08 DIAGNOSIS — M810 Age-related osteoporosis without current pathological fracture: Secondary | ICD-10-CM | POA: Diagnosis not present

## 2021-08-08 DIAGNOSIS — I10 Essential (primary) hypertension: Secondary | ICD-10-CM | POA: Diagnosis not present

## 2021-08-20 DIAGNOSIS — C44622 Squamous cell carcinoma of skin of right upper limb, including shoulder: Secondary | ICD-10-CM | POA: Diagnosis not present

## 2021-08-20 DIAGNOSIS — L578 Other skin changes due to chronic exposure to nonionizing radiation: Secondary | ICD-10-CM | POA: Diagnosis not present

## 2021-08-20 DIAGNOSIS — L821 Other seborrheic keratosis: Secondary | ICD-10-CM | POA: Diagnosis not present

## 2021-08-20 DIAGNOSIS — C44722 Squamous cell carcinoma of skin of right lower limb, including hip: Secondary | ICD-10-CM | POA: Diagnosis not present

## 2021-08-23 DIAGNOSIS — I10 Essential (primary) hypertension: Secondary | ICD-10-CM | POA: Diagnosis not present

## 2021-08-29 DIAGNOSIS — I10 Essential (primary) hypertension: Secondary | ICD-10-CM | POA: Diagnosis not present

## 2021-08-29 DIAGNOSIS — M810 Age-related osteoporosis without current pathological fracture: Secondary | ICD-10-CM | POA: Diagnosis not present

## 2021-09-21 DIAGNOSIS — I10 Essential (primary) hypertension: Secondary | ICD-10-CM | POA: Diagnosis not present

## 2021-10-24 DIAGNOSIS — I10 Essential (primary) hypertension: Secondary | ICD-10-CM | POA: Diagnosis not present

## 2021-10-30 DIAGNOSIS — L821 Other seborrheic keratosis: Secondary | ICD-10-CM | POA: Diagnosis not present

## 2021-10-30 DIAGNOSIS — C44622 Squamous cell carcinoma of skin of right upper limb, including shoulder: Secondary | ICD-10-CM | POA: Diagnosis not present

## 2021-10-30 DIAGNOSIS — L578 Other skin changes due to chronic exposure to nonionizing radiation: Secondary | ICD-10-CM | POA: Diagnosis not present

## 2021-10-30 DIAGNOSIS — C44722 Squamous cell carcinoma of skin of right lower limb, including hip: Secondary | ICD-10-CM | POA: Diagnosis not present

## 2021-11-07 DIAGNOSIS — Z1231 Encounter for screening mammogram for malignant neoplasm of breast: Secondary | ICD-10-CM | POA: Diagnosis not present

## 2021-11-23 DIAGNOSIS — I10 Essential (primary) hypertension: Secondary | ICD-10-CM | POA: Diagnosis not present

## 2022-02-26 DIAGNOSIS — C44722 Squamous cell carcinoma of skin of right lower limb, including hip: Secondary | ICD-10-CM | POA: Diagnosis not present

## 2022-02-26 DIAGNOSIS — C44622 Squamous cell carcinoma of skin of right upper limb, including shoulder: Secondary | ICD-10-CM | POA: Diagnosis not present

## 2022-02-26 DIAGNOSIS — L578 Other skin changes due to chronic exposure to nonionizing radiation: Secondary | ICD-10-CM | POA: Diagnosis not present

## 2022-02-26 DIAGNOSIS — L570 Actinic keratosis: Secondary | ICD-10-CM | POA: Diagnosis not present

## 2022-02-26 DIAGNOSIS — L821 Other seborrheic keratosis: Secondary | ICD-10-CM | POA: Diagnosis not present

## 2022-08-13 DIAGNOSIS — Z0000 Encounter for general adult medical examination without abnormal findings: Secondary | ICD-10-CM | POA: Diagnosis not present

## 2022-08-13 DIAGNOSIS — K4090 Unilateral inguinal hernia, without obstruction or gangrene, not specified as recurrent: Secondary | ICD-10-CM | POA: Diagnosis not present

## 2022-08-13 DIAGNOSIS — E46 Unspecified protein-calorie malnutrition: Secondary | ICD-10-CM | POA: Diagnosis not present

## 2022-08-13 DIAGNOSIS — M810 Age-related osteoporosis without current pathological fracture: Secondary | ICD-10-CM | POA: Diagnosis not present

## 2022-08-13 DIAGNOSIS — I10 Essential (primary) hypertension: Secondary | ICD-10-CM | POA: Diagnosis not present

## 2022-08-13 DIAGNOSIS — I700 Atherosclerosis of aorta: Secondary | ICD-10-CM | POA: Diagnosis not present

## 2022-08-13 DIAGNOSIS — Z23 Encounter for immunization: Secondary | ICD-10-CM | POA: Diagnosis not present

## 2022-08-27 DIAGNOSIS — L570 Actinic keratosis: Secondary | ICD-10-CM | POA: Diagnosis not present

## 2022-08-27 DIAGNOSIS — C44722 Squamous cell carcinoma of skin of right lower limb, including hip: Secondary | ICD-10-CM | POA: Diagnosis not present

## 2022-08-30 DIAGNOSIS — M810 Age-related osteoporosis without current pathological fracture: Secondary | ICD-10-CM | POA: Diagnosis not present

## 2022-08-30 DIAGNOSIS — M8589 Other specified disorders of bone density and structure, multiple sites: Secondary | ICD-10-CM | POA: Diagnosis not present

## 2022-08-30 DIAGNOSIS — Z780 Asymptomatic menopausal state: Secondary | ICD-10-CM | POA: Diagnosis not present

## 2022-11-13 DIAGNOSIS — Z1231 Encounter for screening mammogram for malignant neoplasm of breast: Secondary | ICD-10-CM | POA: Diagnosis not present

## 2022-12-11 DIAGNOSIS — D3132 Benign neoplasm of left choroid: Secondary | ICD-10-CM | POA: Diagnosis not present

## 2023-11-20 DIAGNOSIS — Z1231 Encounter for screening mammogram for malignant neoplasm of breast: Secondary | ICD-10-CM | POA: Diagnosis not present

## 2023-11-28 ENCOUNTER — Telehealth: Payer: Self-pay

## 2023-11-28 NOTE — Telephone Encounter (Signed)
 Left VM to complete E-check in

## 2023-11-30 NOTE — Progress Notes (Deleted)
 No chief complaint on file.  History of Present Illness: 89 yo female with history of aortic atherosclerosis, GERD, HTN and bradycardia who is here today as a new consult, referred by Dr. ***, for the evaluation of *** ? Bradycardia.   Primary Care Physician: Gib Charleston, MD (Inactive)   Past Medical History:  Diagnosis Date   Arthralgia of pelvic region and thigh    Atherosclerosis of aorta (HCC)    GERD (gastroesophageal reflux disease)    Hematoma    Hypertension    Osteoporosis of femur without pathological fracture    Symptomatic bradycardia     Past Surgical History:  Procedure Laterality Date   ABDOMINAL HYSTERECTOMY     ANKLE FRACTURE SURGERY     Left   LUMBAR LAMINECTOMY/DECOMPRESSION MICRODISCECTOMY  09/24/2018   Procedure: Microlumbar decompression Lumbar Four-Five;  Surgeon: Duwayne Purchase, MD;  Location: [facility] OR;  Service: Orthopedics;;    Current Outpatient Medications  Medication Sig Dispense Refill   alendronate (FOSAMAX) 70 MG tablet Take 70 mg by mouth every Friday. Take with a full glass of water on an empty stomach.     amLODipine  (NORVASC ) 10 MG tablet Take 10 mg by mouth daily.     Calcium Carb-Cholecalciferol (CALCIUM 600/VITAMIN D3 PO) Take 1 tablet by mouth daily.     Cholecalciferol (VITAMIN D3 PO) Take 1 capsule by mouth daily.     losartan -hydrochlorothiazide  (HYZAAR) 100-12.5 MG tablet Take 1 tablet by mouth daily.     metoprolol  succinate (TOPROL -XL) 50 MG 24 hr tablet Take 50 mg by mouth daily. Take with or immediately following a meal.     naproxen  sodium (ALEVE ) 220 MG tablet Take 2 tablets (440 mg total) by mouth daily. Resume 5 days post-op as needed     polyethylene glycol (MIRALAX ) packet Take 17 g by mouth daily. 14 each 0   traMADol  (ULTRAM ) 50 MG tablet Take 50 mg by mouth at bedtime.  0   No current facility-administered medications for this visit.    No Known Allergies  Social History   Socioeconomic History   Marital  status: Married    Spouse name: Not on file   Number of children: Not on file   Years of education: Not on file   Highest education level: Not on file  Occupational History   Not on file  Tobacco Use   Smoking status: Never   Smokeless tobacco: Never  Vaping Use   Vaping status: Never Used  Substance and Sexual Activity   Alcohol use: Not Currently   Drug use: Never   Sexual activity: Not on file  Other Topics Concern   Not on file  Social History Narrative   Not on file   Social Drivers of Health   Financial Resource Strain: Not on file  Food Insecurity: Not on file  Transportation Needs: Not on file  Physical Activity: Not on file  Stress: Not on file  Social Connections: Not on file  Intimate Partner Violence: Not on file    Family History  Problem Relation Age of Onset   Diabetes Mother    Coronary artery disease Mother    Hypertension Mother    Heart attack Father    Alcoholism Father    Coronary artery disease Father    Hypertension Brother    CVA Brother    Hypertension Brother    CVA Brother    Hypertension Brother    Heart attack Brother    CVA Son  Heart attack Son     Review of Systems:  As stated in the HPI and otherwise negative.   There were no vitals taken for this visit.  Physical Examination: General: Well developed, well nourished, NAD  HEENT: OP clear, mucus membranes moist  SKIN: warm, dry. No rashes. Neuro: No focal deficits  Musculoskeletal: Muscle strength 5/5 all ext  Psychiatric: Mood and affect normal  Neck: No JVD, no carotid bruits, no thyromegaly, no lymphadenopathy.  Lungs:Clear bilaterally, no wheezes, rhonci, crackles Cardiovascular: Regular rate and rhythm. No murmurs, gallops or rubs. Abdomen:Soft. Bowel sounds present. Non-tender.  Extremities: No lower extremity edema. Pulses are 2 + in the bilateral DP/PT.  EKG:  EKG {ACTION; IS/IS WNU:[id number]} ordered today. The ekg ordered today demonstrates ***  Recent  Labs: No results found for requested labs within last 365 days.   Lipid Panel No results found for: CHOL, TRIG, HDL, CHOLHDL, VLDL, LDLCALC, LDLDIRECT   Wt Readings from Last 3 Encounters:  09/17/18 52.5 kg      Assessment and Plan:   1.   Labs/ tests ordered today include:  No orders of the defined types were placed in this encounter.    Disposition:   F/U with me in ***    Signed, Lonni Cash, MD, [facility] 11/30/2023 6:05 PM    [facility] [address] [place], [place], [place]  [address] Phone: [phone]; Fax: [phone]

## 2023-12-01 ENCOUNTER — Telehealth: Payer: Self-pay | Admitting: *Deleted

## 2023-12-01 ENCOUNTER — Ambulatory Visit: Payer: Medicare Other | Admitting: Cardiovascular Disease

## 2023-12-01 NOTE — Telephone Encounter (Signed)
 S/w pt is aware appt has been canceled due to inclement weather. Scheduler from office will call pt to R/S.

## 2023-12-04 DIAGNOSIS — R55 Syncope and collapse: Secondary | ICD-10-CM | POA: Diagnosis not present

## 2023-12-04 DIAGNOSIS — R42 Dizziness and giddiness: Secondary | ICD-10-CM | POA: Diagnosis not present

## 2024-01-04 DIAGNOSIS — E876 Hypokalemia: Secondary | ICD-10-CM | POA: Diagnosis not present

## 2024-01-04 DIAGNOSIS — I4891 Unspecified atrial fibrillation: Secondary | ICD-10-CM | POA: Diagnosis not present

## 2024-01-04 DIAGNOSIS — Z79899 Other long term (current) drug therapy: Secondary | ICD-10-CM | POA: Diagnosis not present

## 2024-01-04 DIAGNOSIS — Z7901 Long term (current) use of anticoagulants: Secondary | ICD-10-CM | POA: Diagnosis not present

## 2024-01-04 DIAGNOSIS — R55 Syncope and collapse: Secondary | ICD-10-CM | POA: Diagnosis not present

## 2024-01-04 DIAGNOSIS — I10 Essential (primary) hypertension: Secondary | ICD-10-CM | POA: Diagnosis not present

## 2024-01-05 DIAGNOSIS — I340 Nonrheumatic mitral (valve) insufficiency: Secondary | ICD-10-CM | POA: Diagnosis not present

## 2024-01-05 DIAGNOSIS — R001 Bradycardia, unspecified: Secondary | ICD-10-CM | POA: Diagnosis not present

## 2024-01-05 DIAGNOSIS — R9431 Abnormal electrocardiogram [ECG] [EKG]: Secondary | ICD-10-CM | POA: Diagnosis not present

## 2024-01-05 DIAGNOSIS — I480 Paroxysmal atrial fibrillation: Secondary | ICD-10-CM | POA: Diagnosis not present

## 2024-01-05 DIAGNOSIS — I4891 Unspecified atrial fibrillation: Secondary | ICD-10-CM | POA: Diagnosis not present

## 2024-01-05 DIAGNOSIS — I351 Nonrheumatic aortic (valve) insufficiency: Secondary | ICD-10-CM | POA: Diagnosis not present

## 2024-01-05 DIAGNOSIS — I10 Essential (primary) hypertension: Secondary | ICD-10-CM | POA: Diagnosis not present

## 2024-01-05 DIAGNOSIS — R55 Syncope and collapse: Secondary | ICD-10-CM | POA: Diagnosis not present

## 2024-01-06 ENCOUNTER — Telehealth: Payer: Self-pay | Admitting: *Deleted

## 2024-01-06 ENCOUNTER — Ambulatory Visit: Payer: Medicare Other | Attending: Cardiology

## 2024-01-06 ENCOUNTER — Telehealth: Payer: Self-pay | Admitting: Cardiology

## 2024-01-06 DIAGNOSIS — I4891 Unspecified atrial fibrillation: Secondary | ICD-10-CM | POA: Diagnosis not present

## 2024-01-06 DIAGNOSIS — R55 Syncope and collapse: Secondary | ICD-10-CM | POA: Diagnosis not present

## 2024-01-06 DIAGNOSIS — I480 Paroxysmal atrial fibrillation: Secondary | ICD-10-CM | POA: Diagnosis not present

## 2024-01-06 DIAGNOSIS — R9431 Abnormal electrocardiogram [ECG] [EKG]: Secondary | ICD-10-CM | POA: Diagnosis not present

## 2024-01-06 DIAGNOSIS — I10 Essential (primary) hypertension: Secondary | ICD-10-CM | POA: Diagnosis not present

## 2024-01-06 DIAGNOSIS — I351 Nonrheumatic aortic (valve) insufficiency: Secondary | ICD-10-CM | POA: Diagnosis not present

## 2024-01-06 NOTE — Telephone Encounter (Signed)
Order for monitor put in per Dr. Dulce Sellar for pt to wear 14 days for A-Fib

## 2024-01-06 NOTE — Telephone Encounter (Signed)
LVM for pt to call and schedule monitor and 4 week follow up with [facility] consult completed)/KBL 01/06/24

## 2024-01-07 DIAGNOSIS — I4891 Unspecified atrial fibrillation: Secondary | ICD-10-CM | POA: Diagnosis not present

## 2024-01-29 ENCOUNTER — Encounter: Payer: Self-pay | Admitting: Cardiology

## 2024-01-29 DIAGNOSIS — M25559 Pain in unspecified hip: Secondary | ICD-10-CM | POA: Insufficient documentation

## 2024-01-29 DIAGNOSIS — R001 Bradycardia, unspecified: Secondary | ICD-10-CM | POA: Insufficient documentation

## 2024-01-29 DIAGNOSIS — I700 Atherosclerosis of aorta: Secondary | ICD-10-CM | POA: Insufficient documentation

## 2024-01-29 DIAGNOSIS — T148XXA Other injury of unspecified body region, initial encounter: Secondary | ICD-10-CM | POA: Insufficient documentation

## 2024-01-29 DIAGNOSIS — M810 Age-related osteoporosis without current pathological fracture: Secondary | ICD-10-CM | POA: Insufficient documentation

## 2024-01-29 DIAGNOSIS — K219 Gastro-esophageal reflux disease without esophagitis: Secondary | ICD-10-CM | POA: Insufficient documentation

## 2024-01-29 DIAGNOSIS — I10 Essential (primary) hypertension: Secondary | ICD-10-CM | POA: Insufficient documentation

## 2024-02-02 NOTE — Progress Notes (Unsigned)
 Cardiology Office Note:    Date:  02/03/2024   ID:  Donna Christian, DOB December 15, 1934, MRN [id number]  PCP:  Patient, No Pcp Per  Cardiologist:  Norman Herrlich, MD    Referring MD: Elias Else, MD    ASSESSMENT:    1. Paroxysmal atrial fibrillation (HCC)   2. Symptomatic bradycardia   3. Hypokalemia   4. Prolonged QT interval   5. Nonrheumatic aortic valve insufficiency   6. Primary hypertension    PLAN:    In order of problems listed above:  She is doing well with no recurrent atrial arrhythmias self-monitoring of the smart watch for still awaiting her event monitor should be available in the next few days and at this time continue her anticoagulation and her beta-blocker Stable no recurrence of bradycardia I will recheck both magnesium and potassium today QT interval has normalized She has mild aortic regurgitation Well-controlled continue her current combination of medications are wrong in the chart she is not taking an ARB thiazide diuretic she takes Benicar 40 mg daily and amlodipine 10 mg/day along with spironolactone continue the same   Next appointment: 6 months   Medication Adjustments/Labs and Tests Ordered: Current medicines are reviewed at length with the patient today.  Concerns regarding medicines are outlined above.  Orders Placed This Encounter  Procedures   Basic Metabolic Panel (BMET)   Magnesium   EKG 12-Lead   EKG 12-Lead   No orders of the defined types were placed in this encounter.    History of Present Illness:    Donna Christian is a 89 y.o. female with a hx of paroxysmal atrial fibrillation last seen by me at [facility] in consultation 01/05/2024.  She presented the hospital with symptomatic atrial fibrillation post cardioversion she had T wave abnormality spontaneously converting to sinus rhythm in the emergency room associated with hypokalemia.  Her echocardiogram showed normal left ventricular function she had mild aortic regurgitation with  a structurally normal valve.  Her QT prolongation improved with potassium repletion and she was discharged from the hospital for an ambulatory heart rhythm monitor and to see me in follow-up in 4 weeks. Compliance with diet, lifestyle and medications: Yes  She is wearing a smart watch no palpitation no alerts and no recurrent syncope since she left the hospital She feels well and is no longer on a thiazide diuretic She used an event monitor but it is not available although received by the company Her EKG today is artifact but the QT interval is normal the rhythm is sinus with sinus arrhythmia. Her check her BMP and magnesium today She opted \ to be anticoagulated with a brief atrial fibrillation while hospitalized Past Medical History:  Diagnosis Date   Arthralgia of pelvic region and thigh    Atherosclerosis of aorta (HCC)    GERD (gastroesophageal reflux disease)    Hematoma    Hypertension    Lumbar radiculopathy 08/24/2018   Osteoporosis of femur without pathological fracture    Scoliosis deformity of spine 08/24/2018   Spinal stenosis at L4-L5 level 09/24/2018   Symptomatic bradycardia     Current Medications: Current Meds  Medication Sig   alendronate (FOSAMAX) 70 MG tablet Take 70 mg by mouth every Friday. Take with a full glass of water on an empty stomach.   amLODipine (NORVASC) 10 MG tablet Take 10 mg by mouth daily.   Calcium Carb-Cholecalciferol (CALCIUM 600/VITAMIN D3 PO) Take 1 tablet by mouth daily.   Cholecalciferol (VITAMIN D3 PO) Take 1 capsule  by mouth daily.   doxazosin (CARDURA) 4 MG tablet Take 2 mg by mouth at bedtime.   ELIQUIS 5 MG TABS tablet Take 2.5 mg by mouth 2 (two) times daily.   losartan-hydrochlorothiazide (HYZAAR) 100-12.5 MG tablet Take 1 tablet by mouth daily.   metoprolol succinate (TOPROL-XL) 50 MG 24 hr tablet Take 50 mg by mouth daily. Take with or immediately following a meal.   metoprolol tartrate (LOPRESSOR) 50 MG tablet Take 50 mg by  mouth 2 (two) times daily.   naproxen sodium (ALEVE) 220 MG tablet Take 2 tablets (440 mg total) by mouth daily. Resume 5 days post-op as needed   olmesartan (BENICAR) 40 MG tablet Take 40 mg by mouth daily.   olmesartan-hydrochlorothiazide (BENICAR HCT) 40-25 MG tablet Take 1 tablet by mouth daily.   polyethylene glycol (MIRALAX) packet Take 17 g by mouth daily.   spironolactone (ALDACTONE) 25 MG tablet Take 25 mg by mouth daily.   [DISCONTINUED] traMADol (ULTRAM) 50 MG tablet Take 50 mg by mouth at bedtime.      EKGs/Labs/Other Studies Reviewed:   EKG Interpretation Date/Time:  Tuesday February 03 2024 15:48:24 EDT Ventricular Rate:  50 PR Interval:  182 QRS Duration:  76 QT Interval:  494 QTC Calculation: 450 R Axis:   22  Text Interpretation: Sinus bradycardia with Premature atrial complexes Low voltage QRS When compared with ECG of 03-Feb-2024 15:00, No significant change was found Confirmed by Norman Herrlich ([id number]) on 02/03/2024 4:12:11 PM  EKG Interpretation Date/Time:  Tuesday February 03 2024 15:48:24 EDT Ventricular Rate:  50 PR Interval:  182 QRS Duration:  76 QT Interval:  494 QTC Calculation: 450 R Axis:   22  Text Interpretation: Sinus bradycardia with Premature atrial complexes Low voltage QRS When compared with ECG of 03-Feb-2024 15:00, No significant change was found Confirmed by Norman Herrlich ([id number]) on 02/03/2024 4:12:11 PM   EKG Interpretation Date/Time:  Tuesday February 03 2024 15:48:24 EDT Ventricular Rate:  50 PR Interval:  182 QRS Duration:  76 QT Interval:  494 QTC Calculation: 450 R Axis:   22  Text Interpretation: Sinus bradycardia with Premature atrial complexes Low voltage QRS When compared with ECG of 03-Feb-2024 15:00, No significant change was found Confirmed by Norman Herrlich ([id number]) on 02/03/2024 4:12:11 PM   VS:  BP (!) 142/74   Pulse (!) 50   Ht 5\' 1"  (1.549 m)   Wt 106 lb 3.2 oz (48.2 kg)   SpO2 93%   BMI 20.07 kg/m     Wt Readings from Last  3 Encounters:  02/03/24 106 lb 3.2 oz (48.2 kg)  09/17/18 115 lb 11.2 oz (52.5 kg)     GEN:  Well nourished, well developed in no acute distress HEENT: Normal NECK: No JVD; No carotid bruits LYMPHATICS: No lymphadenopathy CARDIAC: RRR, no murmurs, rubs, gallops RESPIRATORY:  Clear to auscultation without rales, wheezing or rhonchi  ABDOMEN: Soft, non-tender, non-distended MUSCULOSKELETAL:  No edema; No deformity  SKIN: Warm and dry NEUROLOGIC:  Alert and oriented x 3 PSYCHIATRIC:  Normal affect    Signed, Norman Herrlich, MD  02/03/2024 4:13 PM    [facility]

## 2024-02-03 ENCOUNTER — Ambulatory Visit: Payer: Medicare Other | Attending: Cardiology | Admitting: Cardiology

## 2024-02-03 ENCOUNTER — Encounter: Payer: Self-pay | Admitting: Cardiology

## 2024-02-03 VITALS — BP 142/74 | HR 50 | Ht 61.0 in | Wt 106.2 lb

## 2024-02-03 DIAGNOSIS — I10 Essential (primary) hypertension: Secondary | ICD-10-CM

## 2024-02-03 DIAGNOSIS — I700 Atherosclerosis of aorta: Secondary | ICD-10-CM

## 2024-02-03 DIAGNOSIS — M810 Age-related osteoporosis without current pathological fracture: Secondary | ICD-10-CM

## 2024-02-03 DIAGNOSIS — R9431 Abnormal electrocardiogram [ECG] [EKG]: Secondary | ICD-10-CM | POA: Diagnosis not present

## 2024-02-03 DIAGNOSIS — I480 Paroxysmal atrial fibrillation: Secondary | ICD-10-CM

## 2024-02-03 DIAGNOSIS — T148XXA Other injury of unspecified body region, initial encounter: Secondary | ICD-10-CM

## 2024-02-03 DIAGNOSIS — E876 Hypokalemia: Secondary | ICD-10-CM

## 2024-02-03 DIAGNOSIS — I351 Nonrheumatic aortic (valve) insufficiency: Secondary | ICD-10-CM

## 2024-02-03 DIAGNOSIS — R001 Bradycardia, unspecified: Secondary | ICD-10-CM | POA: Diagnosis not present

## 2024-02-03 NOTE — Patient Instructions (Signed)
 Medication Instructions:  Your physician recommends that you continue on your current medications as directed. Please refer to the Current Medication list given to you today.  *If you need a refill on your cardiac medications before your next appointment, please call your pharmacy*   Lab Work: Your physician recommends that you return for lab work in:   Labs today: BMP, Magnesium  If you have labs (blood work) drawn today and your tests are completely normal, you will receive your results only by: MyChart Message (if you have MyChart) OR A paper copy in the mail If you have any lab test that is abnormal or we need to change your treatment, we will call you to review the results.   Testing/Procedures: None   Follow-Up: At [facility], you and your health needs are our priority.  As part of our continuing mission to provide you with exceptional heart care, we have created designated Provider Care Teams.  These Care Teams include your primary Cardiologist (physician) and Advanced Practice Providers (APPs -  Physician Assistants and Nurse Practitioners) who all work together to provide you with the care you need, when you need it.  We recommend signing up for the patient portal called "MyChart".  Sign up information is provided on this After Visit Summary.  MyChart is used to connect with patients for Virtual Visits (Telemedicine).  Patients are able to view lab/test results, encounter notes, upcoming appointments, etc.  Non-urgent messages can be sent to your provider as well.   To learn more about what you can do with MyChart, go to [email or web address].    Your next appointment:   6 month(s)  Provider:   Norman Herrlich, MD    Other Instructions None

## 2024-02-04 ENCOUNTER — Ambulatory Visit: Payer: Medicare Other | Admitting: Cardiology

## 2024-02-04 LAB — BASIC METABOLIC PANEL
BUN/Creatinine Ratio: 29 — ABNORMAL HIGH (ref 12–28)
BUN: 22 mg/dL (ref 8–27)
CO2: 22 mmol/L (ref 20–29)
Calcium: 9.7 mg/dL (ref 8.7–10.3)
Chloride: 103 mmol/L (ref 96–106)
Creatinine, Ser: 0.76 mg/dL (ref 0.57–1.00)
Glucose: 87 mg/dL (ref 70–99)
Potassium: 4.7 mmol/L (ref 3.5–5.2)
Sodium: 140 mmol/L (ref 134–144)
eGFR: 76 mL/min/{1.73_m2} (ref >59)

## 2024-02-04 LAB — MAGNESIUM: Magnesium: 2 mg/dL (ref 1.6–2.3)

## 2024-02-08 DIAGNOSIS — I4891 Unspecified atrial fibrillation: Secondary | ICD-10-CM | POA: Diagnosis not present

## 2024-02-09 ENCOUNTER — Telehealth: Payer: Self-pay

## 2024-02-09 NOTE — Telephone Encounter (Signed)
-----   Message from [place] sent at 02/08/2024 11:24 AM EDT ----- Good result no episodes of atrial fibrillation or flutter and no slow heart rhythms of concern.  No change in treatment.

## 2024-02-09 NOTE — Telephone Encounter (Signed)
 Spoke with Paul(ok per DPR), notified of results.

## 2024-03-01 ENCOUNTER — Telehealth: Payer: Self-pay | Admitting: Cardiology

## 2024-03-01 MED ORDER — OLMESARTAN MEDOXOMIL 40 MG PO TABS: 40.0000 mg | ORAL_TABLET | Freq: Every day | ORAL | 3 refills | Status: AC

## 2024-03-01 MED ORDER — SPIRONOLACTONE 25 MG PO TABS: 25.0000 mg | ORAL_TABLET | Freq: Every day | ORAL | 3 refills | Status: AC

## 2024-03-01 NOTE — Telephone Encounter (Signed)
 RX sent

## 2024-03-01 NOTE — Telephone Encounter (Signed)
*  STAT* If patient is at the pharmacy, call can be transferred to refill team.   1. Which medications need to be refilled? (please list name of each medication and dose if known)   spironolactone (ALDACTONE) 25 MG tablet    olmesartan (BENICAR) 40 MG tablet    2. Which pharmacy/location (including street and city if local pharmacy) is medication to be sent to?  [id number] - [place], [place] - [address]     3. Do they need a 30 day or 90 day supply? 90 day   Pt is out of medication

## 2024-08-30 DIAGNOSIS — L821 Other seborrheic keratosis: Secondary | ICD-10-CM | POA: Diagnosis not present

## 2024-08-30 DIAGNOSIS — L728 Other follicular cysts of the skin and subcutaneous tissue: Secondary | ICD-10-CM | POA: Diagnosis not present

## 2024-08-30 DIAGNOSIS — L578 Other skin changes due to chronic exposure to nonionizing radiation: Secondary | ICD-10-CM | POA: Diagnosis not present

## 2024-08-30 NOTE — Progress Notes (Unsigned)
 Cardiology Office Note:    Date:  08/31/2024   ID:  Donna Christian, DOB 07-Jan-1935, MRN [id number]  PCP:  Patient, No Pcp Per  Cardiologist:  Redell Leiter, MD    Referring MD: No ref. provider found    ASSESSMENT:    1. Paroxysmal atrial fibrillation (HCC)   2. Hypokalemia   3. Nonrheumatic aortic valve insufficiency   4. Primary hypertension    PLAN:    In order of problems listed above:  Overall she has done well no recurrence she has a smart watch but she is not capturing heart rhythm and we will give her instructions for her daughter to set up EKG captured through the smart watch for heightened sense of surveillance if recurrent anticoagulation No longer to thiazide diuretic continue spironolactone  for hypertension I will go ahead and check electrolytes magnesium  Is mild aortic regurgitation I do not see the need to repeat an echocardiogram Well-controlled hypertension I am concerned with the blunted heart rate and reduce her beta-blocker 50%   Next appointment: 9 months   Medication Adjustments/Labs and Tests Ordered: Current medicines are reviewed at length with the patient today.  Concerns regarding medicines are outlined above.  No orders of the defined types were placed in this encounter.  No orders of the defined types were placed in this encounter.    History of Present Illness:    Donna Christian is a 89 y.o. female with a hx of Paroxysmal atrial fibrillation hypokalemia with prolonged QT interval hypertension and mild aortic regurgitation last seen 02/03/2024. Compliance with diet, lifestyle and medications: Yes  Overall she has done quite well she gets a little lightheaded and weak climbing the stairs she has had no palpitation or syncope.  Last labs were in March her potassium 4.7 and she is on spironolactone  no longer takes a thiazide diuretic Unfortunately her PCP retired and she has no primary care she asked for a referral and I will send her to the [facility] Resting heart rate is bradycardic, reduce her beta-blocker dose 50% and I will go ahead and check labs today including magnesium  CMP and lipids. Past Medical History:  Diagnosis Date   Arthralgia of pelvic region and thigh    Atherosclerosis of aorta    GERD (gastroesophageal reflux disease)    Hematoma    Hypertension    Lumbar radiculopathy 08/24/2018   Osteoporosis of femur without pathological fracture    Scoliosis deformity of spine 08/24/2018   Spinal stenosis at L4-L5 level 09/24/2018   Symptomatic bradycardia     Current Medications: Current Meds  Medication Sig   alendronate (FOSAMAX) 70 MG tablet Take 70 mg by mouth every Friday. Take with a full glass of water on an empty stomach.   amLODipine  (NORVASC ) 10 MG tablet Take 10 mg by mouth daily.   Calcium Carb-Cholecalciferol (CALCIUM 600/VITAMIN D3 PO) Take 1 tablet by mouth daily.   Cholecalciferol (VITAMIN D3 PO) Take 1 capsule by mouth daily.   metoprolol  succinate (TOPROL -XL) 50 MG 24 hr tablet Take 50 mg by mouth daily. Take with or immediately following a meal.   naproxen  sodium (ALEVE ) 220 MG tablet Take 2 tablets (440 mg total) by mouth daily. Resume 5 days post-op as needed   olmesartan  (BENICAR ) 40 MG tablet Take 1 tablet (40 mg total) by mouth daily.   polyethylene glycol (MIRALAX ) packet Take 17 g by mouth daily.   spironolactone  (ALDACTONE ) 25 MG tablet Take 1 tablet (25 mg total)  by mouth daily.      EKGs/Labs/Other Studies Reviewed:    The following studies were reviewed today:  Cardiac Studies & Procedures   ______________________________________________________________________________________________        Donna  LONG TERM Christian-LIVE TELEMETRY (3-14 DAYS) 02/06/2024  Narrative Patch Wear Time:  13 days and 16 hours (2025-02-11T14:01:39-0500 to 2025-02-25T06:28:39-0500)  Patient had a min HR of 36 bpm, max HR of 143 bpm, and avg HR of 55 bpm.  Predominant underlying rhythm was Sinus Rhythm. First Degree AV Block was present.  There were no triggered or diary events.  There were no sinus pauses 3 seconds or greater and no episodes of second or third-degree AV nodal block.  Ventricular ectopy was rare, there were no episodes of ventricular tachycardia.  Supraventricular ectopy was occasional.  There were no episodes of atrial fibrillation or flutter.  There were brief runs of APCs present longest episode 7 beats rate of 118 bpm atrial tachycardia.  Single episode 4 complexes above idioventricular Rhythm was present.  Isolated SVEs were occasional (2.1%, 22170), SVE Couplets were rare (<1.0%, 1226), and SVE Triplets were rare (<1.0%, 101).  Isolated VEs were rare (<1.0%, 363), VE Triplets were rare (<1.0%, 2), and no VE Couplets were present.       ______________________________________________________________________________________________          Recent Labs: 02/03/2024: BUN 22; Creatinine, Ser 0.76; Magnesium  2.0; Potassium 4.7; Sodium 140  Recent Lipid Panel No results found for: CHOL, TRIG, HDL, CHOLHDL, VLDL, LDLCALC, LDLDIRECT  Physical Exam:    VS:  BP (!) 140/58   Pulse (!) 52   Ht 5' 1 (1.549 m)   Wt 104 lb 3.2 oz (47.3 kg)   SpO2 98%   BMI 19.69 kg/m     Wt Readings from Last 3 Encounters:  08/31/24 104 lb 3.2 oz (47.3 kg)  02/03/24 106 lb 3.2 oz (48.2 kg)  09/17/18 115 lb 11.2 oz (52.5 kg)     GEN:  Well nourished, well developed in no acute distress HEENT: Normal NECK: No JVD; No carotid bruits LYMPHATICS: No lymphadenopathy CARDIAC: RRR, no murmurs, rubs, gallops RESPIRATORY:  Clear to auscultation without rales, wheezing or rhonchi  ABDOMEN: Soft, non-tender, non-distended MUSCULOSKELETAL:  No edema; No deformity  SKIN: Warm and dry NEUROLOGIC:  Alert and oriented x 3 PSYCHIATRIC:  Normal affect    Signed, Redell Leiter, MD  08/31/2024 9:02 AM    [facility]

## 2024-08-31 ENCOUNTER — Ambulatory Visit: Attending: Cardiology | Admitting: Cardiology

## 2024-08-31 ENCOUNTER — Encounter: Payer: Self-pay | Admitting: Cardiology

## 2024-08-31 VITALS — BP 140/58 | HR 52 | Ht 61.0 in | Wt 104.2 lb

## 2024-08-31 DIAGNOSIS — I351 Nonrheumatic aortic (valve) insufficiency: Secondary | ICD-10-CM

## 2024-08-31 DIAGNOSIS — E876 Hypokalemia: Secondary | ICD-10-CM | POA: Diagnosis not present

## 2024-08-31 DIAGNOSIS — I480 Paroxysmal atrial fibrillation: Secondary | ICD-10-CM | POA: Diagnosis not present

## 2024-08-31 DIAGNOSIS — I10 Essential (primary) hypertension: Secondary | ICD-10-CM

## 2024-08-31 MED ORDER — METOPROLOL SUCCINATE ER 25 MG PO TB24: 25.0000 mg | ORAL_TABLET | Freq: Every day | ORAL | 3 refills | Status: AC

## 2024-08-31 NOTE — Addendum Note (Signed)
 Addended by: SHERRE ADE I on: 08/31/2024 09:24 AM   Modules accepted: Orders

## 2024-08-31 NOTE — Patient Instructions (Signed)
 Medication Instructions:  Your physician has recommended you make the following change in your medication:   START: Metoprolol  succcinate 25 mg daily  *If you need a refill on your cardiac medications before your next appointment, please call your pharmacy*  Lab Work: Your physician recommends that you return for lab work in:   Labs today: CMP, Magnesium , Lipids  If you have labs (blood work) drawn today and your tests are completely normal, you will receive your results only by: MyChart Message (if you have MyChart) OR A paper copy in the mail If you have any lab test that is abnormal or we need to change your treatment, we will call you to review the results.  Testing/Procedures: None  Follow-Up: At [facility], you and your health needs are our priority.  As part of our continuing mission to provide you with exceptional heart care, our providers are all part of one team.  This team includes your primary Cardiologist (physician) and Advanced Practice Providers or APPs (Physician Assistants and Nurse Practitioners) who all work together to provide you with the care you need, when you need it.  Your next appointment:   9 month(s)  Provider:   Redell Leiter, MD    We recommend signing up for the patient portal called MyChart.  Sign up information is provided on this After Visit Summary.  MyChart is used to connect with patients for Virtual Visits (Telemedicine).  Patients are able to view lab/test results, encounter notes, upcoming appointments, etc.  Non-urgent messages can be sent to your provider as well.   To learn more about what you can do with MyChart, go to [email or web address].   Other Instructions None

## 2024-09-01 ENCOUNTER — Ambulatory Visit: Payer: Self-pay | Admitting: Cardiology

## 2024-09-01 LAB — COMPREHENSIVE METABOLIC PANEL WITH GFR
ALT: 13 IU/L (ref 0–32)
AST: 16 IU/L (ref 0–40)
Albumin: 4.3 g/dL (ref 3.7–4.7)
Alkaline Phosphatase: 57 IU/L (ref 48–129)
BUN/Creatinine Ratio: 37 — ABNORMAL HIGH (ref 12–28)
BUN: 26 mg/dL (ref 8–27)
Bilirubin Total: 0.3 mg/dL (ref 0.0–1.2)
CO2: 23 mmol/L (ref 20–29)
Calcium: 10.1 mg/dL (ref 8.7–10.3)
Chloride: 104 mmol/L (ref 96–106)
Creatinine, Ser: 0.71 mg/dL (ref 0.57–1.00)
Globulin, Total: 2.1 g/dL (ref 1.5–4.5)
Glucose: 107 mg/dL — ABNORMAL HIGH (ref 70–99)
Potassium: 4.3 mmol/L (ref 3.5–5.2)
Sodium: 142 mmol/L (ref 134–144)
Total Protein: 6.4 g/dL (ref 6.0–8.5)
eGFR: 83 mL/min/1.73 (ref >59)

## 2024-09-01 LAB — LIPID PANEL
Chol/HDL Ratio: 2.4 ratio (ref 0.0–4.4)
Cholesterol, Total: 172 mg/dL (ref 100–199)
HDL: 73 mg/dL (ref >39)
LDL Chol Calc (NIH): 84 mg/dL (ref 0–99)
Triglycerides: 80 mg/dL (ref 0–149)
VLDL Cholesterol Cal: 15 mg/dL (ref 5–40)

## 2024-09-01 LAB — MAGNESIUM: Magnesium: 2 mg/dL (ref 1.6–2.3)

## 2024-09-06 DIAGNOSIS — D3132 Benign neoplasm of left choroid: Secondary | ICD-10-CM | POA: Diagnosis not present

## 2024-09-09 DIAGNOSIS — I700 Atherosclerosis of aorta: Secondary | ICD-10-CM | POA: Diagnosis not present

## 2024-09-09 DIAGNOSIS — Z0000 Encounter for general adult medical examination without abnormal findings: Secondary | ICD-10-CM | POA: Diagnosis not present

## 2024-09-09 DIAGNOSIS — R7301 Impaired fasting glucose: Secondary | ICD-10-CM | POA: Diagnosis not present

## 2024-09-09 DIAGNOSIS — I480 Paroxysmal atrial fibrillation: Secondary | ICD-10-CM | POA: Diagnosis not present

## 2024-09-09 DIAGNOSIS — Z23 Encounter for immunization: Secondary | ICD-10-CM | POA: Diagnosis not present

## 2024-09-09 DIAGNOSIS — I10 Essential (primary) hypertension: Secondary | ICD-10-CM | POA: Diagnosis not present

## 2024-09-09 DIAGNOSIS — M810 Age-related osteoporosis without current pathological fracture: Secondary | ICD-10-CM | POA: Diagnosis not present
